# Patient Record
Sex: Male | Born: 1980 | Race: White | Hispanic: No | Marital: Married | Smoking: Never smoker
Health system: Southern US, Community
[De-identification: ages and names within clinical notes are randomized; demographics above are authoritative.]

## PROBLEM LIST (undated history)

## (undated) DIAGNOSIS — I10 Essential (primary) hypertension: Secondary | ICD-10-CM

## (undated) HISTORY — DX: Essential (primary) hypertension: I10

---
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
## 2003-06-26 ENCOUNTER — Emergency Department (HOSPITAL_COMMUNITY): Admission: EM | Admit: 2003-06-26 | Discharge: 2003-06-26 | Payer: Self-pay | Admitting: Emergency Medicine

## 2003-06-28 ENCOUNTER — Emergency Department (HOSPITAL_COMMUNITY): Admission: EM | Admit: 2003-06-28 | Discharge: 2003-06-28 | Payer: Self-pay

## 2005-11-07 ENCOUNTER — Emergency Department (HOSPITAL_COMMUNITY): Admission: EM | Admit: 2005-11-07 | Discharge: 2005-11-07 | Payer: Self-pay | Admitting: *Deleted

## 2010-09-24 ENCOUNTER — Ambulatory Visit: Payer: Self-pay | Admitting: Internal Medicine

## 2010-09-24 DIAGNOSIS — I10 Essential (primary) hypertension: Secondary | ICD-10-CM | POA: Insufficient documentation

## 2010-09-24 DIAGNOSIS — E785 Hyperlipidemia, unspecified: Secondary | ICD-10-CM | POA: Insufficient documentation

## 2010-09-24 LAB — CONVERTED CEMR LAB
ALT: 31 units/L (ref 0–53)
AST: 25 units/L (ref 0–37)
Albumin: 4.5 g/dL (ref 3.5–5.2)
Alkaline Phosphatase: 28 units/L — ABNORMAL LOW (ref 39–117)
BUN: 14 mg/dL (ref 6–23)
Basophils Absolute: 0 10*3/uL (ref 0.0–0.1)
Basophils Relative: 0.6 % (ref 0.0–3.0)
Bilirubin, Direct: 0.2 mg/dL (ref 0.0–0.3)
CO2: 27 meq/L (ref 19–32)
Calcium: 9.9 mg/dL (ref 8.4–10.5)
Chloride: 104 meq/L (ref 96–112)
Cholesterol: 141 mg/dL (ref 0–200)
Creatinine, Ser: 1 mg/dL (ref 0.4–1.5)
Direct LDL: 44.7 mg/dL
Eosinophils Absolute: 0.2 10*3/uL (ref 0.0–0.7)
Eosinophils Relative: 2.4 % (ref 0.0–5.0)
GFR calc non Af Amer: 92.63 mL/min (ref >60)
Glucose, Bld: 100 mg/dL — ABNORMAL HIGH (ref 70–99)
HCT: 42.8 % (ref 39.0–52.0)
HDL: 30 mg/dL — ABNORMAL LOW (ref >39.00)
Hemoglobin: 14.9 g/dL (ref 13.0–17.0)
Lymphocytes Relative: 35.1 % (ref 12.0–46.0)
Lymphs Abs: 2.6 10*3/uL (ref 0.7–4.0)
MCHC: 34.8 g/dL (ref 30.0–36.0)
MCV: 87.2 fL (ref 78.0–100.0)
Monocytes Absolute: 0.6 10*3/uL (ref 0.1–1.0)
Monocytes Relative: 8.2 % (ref 3.0–12.0)
Neutro Abs: 4 10*3/uL (ref 1.4–7.7)
Neutrophils Relative %: 53.7 % (ref 43.0–77.0)
Platelets: 260 10*3/uL (ref 150.0–400.0)
Potassium: 3.7 meq/L (ref 3.5–5.1)
RBC: 4.92 M/uL (ref 4.22–5.81)
RDW: 13.5 % (ref 11.5–14.6)
Sodium: 140 meq/L (ref 135–145)
TSH: 3.03 microintl units/mL (ref 0.35–5.50)
Total Bilirubin: 0.9 mg/dL (ref 0.3–1.2)
Total CHOL/HDL Ratio: 5
Total Protein: 7.2 g/dL (ref 6.0–8.3)
Triglycerides: 427 mg/dL — ABNORMAL HIGH (ref 0.0–149.0)
VLDL: 85.4 mg/dL — ABNORMAL HIGH (ref 0.0–40.0)
WBC: 7.4 10*3/uL (ref 4.5–10.5)

## 2010-09-25 ENCOUNTER — Encounter: Payer: Self-pay | Admitting: Internal Medicine

## 2010-11-25 ENCOUNTER — Ambulatory Visit: Payer: Self-pay | Admitting: Internal Medicine

## 2010-12-02 ENCOUNTER — Encounter: Admit: 2010-12-02 | Payer: Self-pay | Admitting: Internal Medicine

## 2011-01-12 NOTE — Letter (Signed)
Summary: Lipid Letter  [facility]  [address] [place], [place] [address]   Phone: [phone]  Fax: [phone]    09/25/2010  Amiri Riechers [address] [place], [place]  [address]  Dear Jonny Ruiz:  We have carefully reviewed your last lipid profile from  and the results are noted below with a summary of recommendations for lipid management.    Cholesterol:       141     Goal: <200   HDL "good" Cholesterol:   95.28     Goal: >40 too low   LDL "bad" Cholesterol:   45     Goal: <130   Triglycerides:       427.0     Goal: <150 way too high        TLC Diet (Therapeutic Lifestyle Change): Saturated Fats & Transfatty acids should be kept < 7% of total calories ***Reduce Saturated Fats Polyunstaurated Fat can be up to 10% of total calories Monounsaturated Fat Fat can be up to 20% of total calories Total Fat should be no greater than 25-35% of total calories Carbohydrates should be 50-60% of total calories Protein should be approximately 15% of total calories Fiber should be at least 20-30 grams a day ***Increased fiber may help lower LDL Total Cholesterol should be < 200mg /day Consider adding plant stanol/sterols to diet (example: Benacol spread) ***A higher intake of unsaturated fat may reduce Triglycerides and Increase HDL    Adjunctive Measures (may lower LIPIDS and reduce risk of Heart Attack) include: Aerobic Exercise (20-30 minutes 3-4 times a week) Limit Alcohol Consumption Weight Reduction Aspirin 75-81 mg a day by mouth (if not allergic or contraindicated) Dietary Fiber 20-30 grams a day by mouth     Current Medications: 1)    Benicar 40 Mg Tabs (Olmesartan medoxomil) .... One by mouth once daily for high blood pressure  If you have any questions, please call. We appreciate being able to work with you.   Sincerely,    [facility] Etta Grandchild MD

## 2011-01-12 NOTE — Assessment & Plan Note (Signed)
Summary: NEW PT/BCBS/#/LB   Vital Signs:  Patient profile:   30 year old male Height:      71 inches Weight:      221 pounds BMI:     30.93 O2 Sat:      98 % on Room air Temp:     97.9 degrees F oral Pulse rate:   74 / minute Pulse rhythm:   regular Resp:     16 per minute BP sitting:   150 / 94  (left arm) Cuff size:   large  Vitals Entered By: Rock Nephew CMA (September 24, 2010 3:08 PM)  Nutrition Counseling: Patient's BMI is greater than 25 and therefore counseled on weight management options.  O2 Flow:  Room air CC: Patient need to establish a new PCP, Hypertension Management Is Patient Diabetic? No Pain Assessment Patient in pain? no       Does patient need assistance? Functional Status Self care Ambulation Normal   Primary Care Dimond Crotty:  Etta Grandchild MD  CC:  Patient need to establish a new PCP and Hypertension Management.  History of Present Illness: New to me he wants to start medicine for high blood pressure.  Hypertension History:      He denies headache, chest pain, palpitations, dyspnea with exertion, orthopnea, PND, peripheral edema, visual symptoms, neurologic problems, and syncope.        Positive major cardiovascular risk factors include hyperlipidemia and hypertension.  Negative major cardiovascular risk factors include male age less than 66 years old, no history of diabetes, negative family history for ischemic heart disease, and non-tobacco-user status.        Further assessment for target organ damage reveals no history of ASHD, cardiac end-organ damage (CHF/LVH), stroke/TIA, peripheral vascular disease, renal insufficiency, or hypertensive retinopathy.     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    Physical Exam  General:  alert, well-developed, well-nourished, well-hydrated, appropriate dress, normal appearance, healthy-appearing, cooperative to examination, and good hygiene.   Head:  normocephalic, atraumatic, no abnormalities observed, and no abnormalities palpated.   Eyes:  vision grossly intact, pupils equal, pupils round, and pupils reactive to light.   Mouth:  Oral mucosa and oropharynx without lesions or exudates.  Teeth in good repair. Neck:  supple, full ROM, no masses, no thyromegaly, no JVD, normal carotid upstroke, no carotid bruits, no cervical lymphadenopathy, and no neck tenderness.   Lungs:  normal respiratory effort, no intercostal retractions, no accessory muscle use, normal breath sounds, no dullness, no fremitus, no crackles, and no wheezes.   Heart:  normal rate, regular rhythm, no murmur, no gallop, no rub, and no JVD.   Abdomen:  soft, non-tender, normal bowel sounds, no distention, no masses, no guarding, no rigidity, no rebound tenderness, no abdominal hernia, no  inguinal hernia, no hepatomegaly, and no splenomegaly.   Msk:  No deformity or scoliosis noted of thoracic or lumbar spine.   Pulses:  R and L carotid,radial,femoral,dorsalis pedis and posterior tibial pulses are full and equal bilaterally Extremities:  No clubbing,  cyanosis, edema, or deformity noted with normal full range of motion of all joints.   Neurologic:  No cranial nerve deficits noted. Station and gait are normal. Plantar reflexes are down-going bilaterally. DTRs are symmetrical throughout. Sensory, motor and coordinative functions appear intact. Skin:  Intact without suspicious lesions or rashes Cervical Nodes:  no anterior cervical adenopathy and no posterior cervical adenopathy.   Axillary Nodes:  no R axillary adenopathy and no L axillary adenopathy.   Inguinal Nodes:  no R inguinal adenopathy and no L inguinal adenopathy.   Psych:  Cognition and judgment appear intact. Alert and cooperative with normal attention span and concentration. No apparent delusions, illusions, hallucinations   Impression & Recommendations:  Problem # 1:  HYPERTENSION (ICD-401.9) Assessment New  His updated medication list for this problem includes:    Benicar 40 Mg Tabs (Olmesartan medoxomil) ..... One by mouth once daily for high blood pressure  Orders: Venipuncture ([address]) TLB-Lipid Panel (80061-LIPID) TLB-BMP (Basic Metabolic Panel-BMET) (80048-METABOL) TLB-CBC Platelet - w/Differential (85025-CBCD) TLB-Hepatic/Liver Function Pnl (80076-HEPATIC) TLB-TSH (Thyroid Stimulating Hormone) (84443-TSH)  BP today: 150/94  Problem # 2:  HYPERLIPIDEMIA (ICD-272.4) Assessment: New  Orders: Venipuncture (60454) TLB-Lipid Panel (80061-LIPID) TLB-BMP (Basic Metabolic Panel-BMET) (80048-METABOL) TLB-CBC Platelet - w/Differential (85025-CBCD) TLB-Hepatic/Liver Function Pnl (80076-HEPATIC) TLB-TSH (Thyroid Stimulating Hormone) (84443-TSH)  Complete Medication List: 1)  Benicar 40 Mg Tabs (Olmesartan medoxomil) .... One by mouth once daily for high blood pressure  Other Orders: Tdap => 20yrs IM (09811) Admin 1st Vaccine (91478)  Hypertension Assessment/Plan:      The patient's hypertensive risk group is category B: At least one risk factor (excluding  diabetes) with no target organ damage.  Today's blood pressure is 150/94.  His blood pressure goal is < 140/90.  Patient Instructions: 1)  Please schedule a follow-up appointment in 2 months. 2)  It is important that you exercise regularly at least 20 minutes 5 times a week. If you develop chest pain, have severe difficulty breathing, or feel very tired , stop exercising immediately and seek medical attention. 3)  Check your Blood Pressure regularly. If it is above 140/90: you should make an appointment. 4)  Limit your Sodium (Salt) to less than 4 grams a day (slightly less than 1 teaspoon) to prevent fluid retention, swelling, or worsening or symptoms. Prescriptions: BENICAR 40 MG TABS (OLMESARTAN MEDOXOMIL) One by mouth once daily for high blood pressure  #105 x 0   Entered and Authorized by:   Etta Grandchild MD   Signed by:   Etta Grandchild MD on 09/24/2010   Method used:   Samples Given   RxID:   [id number]    Immunizations Administered:  Tetanus Vaccine:    Vaccine Type: Tdap    Site: right deltoid    Mfr: GlaxoSmithKline    Dose: 0.5 ml    Route: IM    Given by: Rock Nephew CMA    VIS given: 10/30/08 version given September 24, 2010.

## 2011-01-14 NOTE — Assessment & Plan Note (Signed)
Summary: PER PT 2 MTH FU--STC   Vital Signs:  Patient profile:   30 year old male Height:      71 inches Weight:      228 pounds BMI:     31.91 O2 Sat:      97 % on Room air Temp:     98.8 degrees F oral Pulse rate:   79 / minute Pulse rhythm:   regular Resp:     16 per minute BP sitting:   126 / 80  (left arm) Cuff size:   large  Vitals Entered By: Rock Nephew CMA (November 25, 2010 9:02 AM)  Nutrition Counseling: Patient's BMI is greater than 25 and therefore counseled on weight management options.  O2 Flow:  Room air CC: Patient here for follow up Is Patient Diabetic? No Pain Assessment Patient in pain? no       Does patient need assistance? Functional Status Self care Ambulation Normal   Primary Care Provider:  Etta Grandchild MD  CC:  Patient here for follow up.  History of Present Illness:  Follow-Up Visit      This is a 30 year old man who presents for Follow-up visit.  The patient denies chest pain, palpitations, dizziness, syncope, low blood sugar symptoms, high blood sugar symptoms, edema, SOB, DOE, PND, and orthopnea.  Since the last visit the patient notes no new problems or concerns.  The patient reports taking meds as prescribed, monitoring BP, and dietary noncompliance.  When questioned about possible medication side effects, the patient notes none.    Preventive Screening-Counseling & Management  Alcohol-Tobacco     Alcohol drinks/day: 0     Alcohol Counseling: not indicated; patient does not drink     Smoking Status: never     Tobacco Counseling: not indicated; no tobacco use  Hep-HIV-STD-Contraception     Hepatitis Risk: no risk noted     HIV Risk: no risk noted     STD Risk: no risk noted      Sexual History:  currently monogamous.        Drug Use:  never.        Blood Transfusions:  no.    Clinical Review Panels:  Immunizations   Last Tetanus Booster:  Tdap (09/24/2010)  Lipid Management   Cholesterol:  141 (09/24/2010)  HDL (good cholesterol):  30.00 (09/24/2010)  Diabetes Management   Creatinine:  1.0 (09/24/2010)  CBC   WBC:  7.4 (09/24/2010)   RBC:  4.92 (09/24/2010)   Hgb:  14.9 (09/24/2010)   Hct:  42.8 (09/24/2010)   Platelets:  260.0 (09/24/2010)   MCV  87.2 (09/24/2010)   MCHC  34.8 (09/24/2010)   RDW  13.5 (09/24/2010)   PMN:  53.7 (09/24/2010)   Lymphs:  35.1 (09/24/2010)   Monos:  8.2 (09/24/2010)   Eosinophils:  2.4 (09/24/2010)   Basophil:  0.6 (09/24/2010)  Complete Metabolic Panel   Glucose:  100 (09/24/2010)   Sodium:  140 (09/24/2010)   Potassium:  3.7 (09/24/2010)   Chloride:  104 (09/24/2010)   CO2:  27 (09/24/2010)   BUN:  14 (09/24/2010)   Creatinine:  1.0 (09/24/2010)   Albumin:  4.5 (09/24/2010)   Total Protein:  7.2 (09/24/2010)   Calcium:  9.9 (09/24/2010)   Total Bili:  0.9 (09/24/2010)   Alk Phos:  28 (09/24/2010)   SGPT (ALT):  31 (09/24/2010)   SGOT (AST):  25 (09/24/2010)   Medications Prior to Update: 1)  Benicar 40 Mg Tabs (  Olmesartan Medoxomil) .... One By Mouth Once Daily For High Blood Pressure  Current Medications (verified): 1)  Benicar 40 Mg Tabs (Olmesartan Medoxomil) .... 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 Family History Diabetes 1st degree relative Family History High cholesterol Family History Hypertension  Social History: Reviewed history from 09/24/2010 and no changes required. 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  The patient denies anorexia, fever, weight loss, chest pain, syncope, dyspnea on exertion, peripheral edema, prolonged cough, headaches, hemoptysis, abdominal pain, hematuria, muscle weakness, suspicious skin lesions, difficulty walking, depression, unusual weight change, and abnormal bleeding.    Physical Exam  General:  alert, well-developed, well-nourished, well-hydrated, appropriate dress, normal appearance, healthy-appearing, cooperative to examination, and good hygiene.  overweight-appearing.   Head:  normocephalic, atraumatic, no abnormalities observed, and no abnormalities palpated.   Eyes:  vision grossly intact and no injection.   Mouth:  Oral mucosa and oropharynx without lesions or exudates.  Teeth in good repair. Neck:  supple, full ROM, no masses, no thyromegaly, no JVD, normal carotid upstroke, no carotid bruits, no cervical lymphadenopathy, and no neck tenderness.   Lungs:  normal respiratory effort, no intercostal retractions, no accessory muscle use, normal breath sounds, no dullness, no fremitus, no crackles, and no wheezes.   Heart:  normal rate, regular rhythm, no murmur, no gallop, no rub, and no JVD.   Abdomen:  soft, non-tender, normal bowel sounds, no distention, no masses, no guarding, no rigidity, no rebound tenderness, no abdominal hernia, no inguinal hernia, no hepatomegaly, and no splenomegaly.   Msk:  No deformity or scoliosis noted of  thoracic or lumbar spine.   Pulses:  R and L carotid,radial,femoral,dorsalis pedis and posterior tibial pulses are full and equal bilaterally Extremities:  No clubbing, cyanosis, edema, or deformity noted with normal full range of motion of all joints.   Neurologic:  No cranial nerve deficits noted. Station and gait are normal. Plantar reflexes are down-going bilaterally. DTRs are symmetrical throughout. Sensory, motor and coordinative functions appear intact. Skin:  Intact without suspicious lesions or rashes Cervical Nodes:  no anterior cervical adenopathy and no posterior cervical adenopathy.   Axillary Nodes:  no R axillary adenopathy and no L axillary adenopathy.   Psych:  Cognition and judgment appear intact. Alert and cooperative with normal attention span and concentration. No apparent delusions, illusions, hallucinations   Impression & Recommendations:  Problem # 1:  HYPERTENSION (ICD-401.9) Assessment Improved  His updated medication list for this problem includes:    Benicar 40 Mg Tabs (Olmesartan medoxomil) ..... One by mouth once daily for high blood pressure  Orders:  TLB-BMP (Basic Metabolic Panel-BMET) (80048-METABOL) Nutrition Referral (Nutrition)  BP today: 126/80 Prior BP: 150/94 (09/24/2010)  Prior 10 Yr Risk Heart Disease: Not enough information (09/24/2010)  Labs Reviewed: K+: 3.7 (09/24/2010) Creat: : 1.0 (09/24/2010)   Chol: 141 (09/24/2010)   HDL: 30.00 (09/24/2010)   TG: 427.0 (09/24/2010)  Problem # 2:  HYPERLIPIDEMIA (ICD-272.4) Assessment: Deteriorated  Orders: Venipuncture (81191) TLB-Lipid Panel (80061-LIPID) Nutrition Referral (Nutrition)  Labs Reviewed: SGOT: 25 (09/24/2010)   SGPT: 31 (09/24/2010)  Prior 10 Yr Risk Heart Disease: Not enough information (09/24/2010)   HDL:30.00 (09/24/2010)  Chol:141 (09/24/2010)  Trig:427.0 (09/24/2010)  Complete Medication List: 1)  Benicar 40 Mg Tabs (Olmesartan medoxomil) .... One by mouth once daily  for high blood pressure  Patient Instructions: 1)  Please schedule a follow-up appointment in 4 months. 2)  It is important that you exercise regularly at least 20 minutes 5 times a week. If you develop chest pain, have severe difficulty breathing, or feel very tired , stop exercising immediately and seek medical attention. 3)  You need to lose weight. Consider a lower calorie diet and regular exercise.  4)  Check your Blood Pressure regularly. If it is above 130/80: you should make an appointment. Prescriptions: BENICAR 40 MG TABS (OLMESARTAN MEDOXOMIL) One by mouth once daily for high blood pressure  #30 x 11   Entered and Authorized by:   Etta Grandchild MD   Signed by:   Etta Grandchild MD on 11/25/2010   Method used:   Print then Give to Patient   RxID:   [id number]    Orders Added: 1)  Venipuncture [46962] 2)  TLB-Lipid Panel [80061-LIPID] 3)  TLB-BMP (Basic Metabolic Panel-BMET) [80048-METABOL] 4)  Nutrition Referral [Nutrition] 5)  Est. Patient Level IV [95284]

## 2011-03-17 ENCOUNTER — Emergency Department (HOSPITAL_COMMUNITY): Payer: BC Managed Care – PPO

## 2011-03-17 ENCOUNTER — Emergency Department (HOSPITAL_COMMUNITY)
Admission: EM | Admit: 2011-03-17 | Discharge: 2011-03-17 | Disposition: A | Discharge status: Home / Self Care | Payer: BC Managed Care – PPO | Attending: Emergency Medicine | Admitting: Emergency Medicine

## 2011-03-17 ENCOUNTER — Telehealth: Payer: Self-pay | Admitting: *Deleted

## 2011-03-17 DIAGNOSIS — R209 Unspecified disturbances of skin sensation: Secondary | ICD-10-CM | POA: Insufficient documentation

## 2011-03-17 DIAGNOSIS — R29810 Facial weakness: Secondary | ICD-10-CM | POA: Insufficient documentation

## 2011-03-17 DIAGNOSIS — I10 Essential (primary) hypertension: Secondary | ICD-10-CM | POA: Insufficient documentation

## 2011-03-17 DIAGNOSIS — R51 Headache: Secondary | ICD-10-CM | POA: Insufficient documentation

## 2011-03-17 DIAGNOSIS — G510 Bell's palsy: Secondary | ICD-10-CM | POA: Insufficient documentation

## 2011-03-17 DIAGNOSIS — M542 Cervicalgia: Secondary | ICD-10-CM | POA: Insufficient documentation

## 2011-03-17 NOTE — Telephone Encounter (Signed)
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 Will f/u next week if no better or worse.

## 2011-03-17 NOTE — Telephone Encounter (Signed)
ok 

## 2011-03-17 NOTE — Telephone Encounter (Signed)
Patient requesting work in apt w/Dr Yetta Barre. He was seen by UC for tingling in his face and tongue. They "couldn't tell him anything" - per his google research he thinks he has bells palsy. Pt is very worried and req work in apt w/MD soon.

## 2012-02-17 ENCOUNTER — Ambulatory Visit: Payer: BC Managed Care – PPO | Admitting: Internal Medicine

## 2012-02-18 ENCOUNTER — Encounter: Payer: Self-pay | Admitting: Internal Medicine

## 2012-02-18 ENCOUNTER — Other Ambulatory Visit (INDEPENDENT_AMBULATORY_CARE_PROVIDER_SITE_OTHER): Payer: BC Managed Care – PPO

## 2012-02-18 ENCOUNTER — Ambulatory Visit (INDEPENDENT_AMBULATORY_CARE_PROVIDER_SITE_OTHER): Payer: BC Managed Care – PPO | Admitting: Internal Medicine

## 2012-02-18 DIAGNOSIS — I10 Essential (primary) hypertension: Secondary | ICD-10-CM

## 2012-02-18 DIAGNOSIS — G4730 Sleep apnea, unspecified: Secondary | ICD-10-CM

## 2012-02-18 DIAGNOSIS — E785 Hyperlipidemia, unspecified: Secondary | ICD-10-CM

## 2012-02-18 DIAGNOSIS — R7309 Other abnormal glucose: Secondary | ICD-10-CM

## 2012-02-18 DIAGNOSIS — G4733 Obstructive sleep apnea (adult) (pediatric): Secondary | ICD-10-CM | POA: Insufficient documentation

## 2012-02-18 LAB — CBC WITH DIFFERENTIAL/PLATELET
Basophils Relative: 0.4 % (ref 0.0–3.0)
Eosinophils Absolute: 0.2 10*3/uL (ref 0.0–0.7)
HCT: 44 % (ref 39.0–52.0)
Lymphs Abs: 2.4 10*3/uL (ref 0.7–4.0)
MCHC: 34.7 g/dL (ref 30.0–36.0)
MCV: 86.8 fl (ref 78.0–100.0)
Monocytes Absolute: 0.7 10*3/uL (ref 0.1–1.0)
Neutro Abs: 5.2 10*3/uL (ref 1.4–7.7)
Neutrophils Relative %: 61 % (ref 43.0–77.0)
RBC: 5.06 Mil/uL (ref 4.22–5.81)

## 2012-02-18 LAB — COMPREHENSIVE METABOLIC PANEL
Alkaline Phosphatase: 25 U/L — ABNORMAL LOW (ref 39–117)
BUN: 18 mg/dL (ref 6–23)
Glucose, Bld: 60 mg/dL — ABNORMAL LOW (ref 70–99)
Total Bilirubin: 0.6 mg/dL (ref 0.3–1.2)

## 2012-02-18 LAB — LIPID PANEL
Cholesterol: 146 mg/dL (ref 0–200)
HDL: 39.7 mg/dL (ref >39.00)
VLDL: 63 mg/dL — ABNORMAL HIGH (ref 0.0–40.0)

## 2012-02-18 LAB — URINALYSIS, ROUTINE W REFLEX MICROSCOPIC
Hgb urine dipstick: NEGATIVE
Ketones, ur: NEGATIVE
Specific Gravity, Urine: 1.025 (ref 1.000–1.030)
Total Protein, Urine: NEGATIVE
Urine Glucose: NEGATIVE
Urobilinogen, UA: 0.2 (ref 0.0–1.0)

## 2012-02-18 LAB — HEMOGLOBIN A1C: Hgb A1c MFr Bld: 5 % (ref 4.6–6.5)

## 2012-02-18 LAB — LDL CHOLESTEROL, DIRECT: Direct LDL: 46.5 mg/dL

## 2012-02-18 MED ORDER — OLMESARTAN MEDOXOMIL 20 MG PO TABS: 20.0000 mg | ORAL_TABLET | Freq: Every day | ORAL | Status: DC

## 2012-02-18 NOTE — Assessment & Plan Note (Signed)
I will recheck his FLP today and have encouraged him to improve on his lifestyle modifications

## 2012-02-18 NOTE — Assessment & Plan Note (Signed)
He will restart Benicar

## 2012-02-18 NOTE — Assessment & Plan Note (Signed)
Labs ordered today to look for secondary causes and complications, he will get a sleep eval done as well. He agrees to work on his lifestyle modifications as well.

## 2012-02-18 NOTE — Assessment & Plan Note (Signed)
a1c today to see if he has DM II

## 2012-02-18 NOTE — Patient Instructions (Signed)

## 2012-02-18 NOTE — Progress Notes (Signed)
  Subjective:    Patient ID: Erik Huffman, male    DOB: 07/04/1981, 31 y.o.   MRN: [id number]  Hypertension This is a chronic problem. The current episode started more than 1 year ago. The problem has been gradually worsening since onset. The problem is uncontrolled. Pertinent negatives include no anxiety, blurred vision, chest pain, headaches, malaise/fatigue, neck pain, orthopnea, palpitations, peripheral edema, PND, shortness of breath or sweats. There are no associated agents to hypertension. Past treatments include nothing. Compliance problems include exercise, diet and psychosocial issues.       Review of Systems  Constitutional: Positive for fatigue and unexpected weight change (weight gain). Negative for fever, chills, malaise/fatigue, diaphoresis, activity change and appetite change.  HENT: Negative.  Negative for neck pain.   Eyes: Negative.  Negative for blurred vision.  Respiratory: Positive for apnea (and heavy snoring) and choking (and gasping at night). Negative for cough, chest tightness, shortness of breath, wheezing and stridor.   Cardiovascular: Negative for chest pain, palpitations, orthopnea, leg swelling and PND.  Gastrointestinal: Negative for nausea, vomiting, abdominal pain, diarrhea, constipation, blood in stool, abdominal distention, anal bleeding and rectal pain.  Genitourinary: Negative.   Musculoskeletal: Negative for myalgias, back pain, joint swelling, arthralgias and gait problem.  Skin: Negative for color change, pallor, rash and wound.  Neurological: Negative for dizziness, tremors, seizures, syncope, facial asymmetry, speech difficulty, weakness, light-headedness, numbness and headaches.  Hematological: Negative for adenopathy. Does not bruise/bleed easily.  Psychiatric/Behavioral: Negative.        Objective:   Physical Exam  Vitals reviewed. Constitutional: He is oriented to person, place, and time. He appears well-developed and well-nourished. No  distress.  HENT:  Head: Normocephalic and atraumatic.  Mouth/Throat: Oropharynx is clear and moist. No oropharyngeal exudate.  Eyes: Conjunctivae are normal. Right eye exhibits no discharge. Left eye exhibits no discharge. No scleral icterus.  Neck: Normal range of motion. Neck supple. No JVD present. No tracheal deviation present. No thyromegaly present.  Cardiovascular: Normal rate, regular rhythm, normal heart sounds and intact distal pulses.  Exam reveals no gallop and no friction rub.   No murmur heard. Pulmonary/Chest: Effort normal and breath sounds normal. No stridor. No respiratory distress. He has no wheezes. He has no rales. He exhibits no tenderness.  Abdominal: Soft. Bowel sounds are normal. He exhibits no distension and no mass. There is no tenderness. There is no rebound and no guarding.  Musculoskeletal: Normal range of motion. He exhibits no edema and no tenderness.  Lymphadenopathy:    He has no cervical adenopathy.  Neurological: He is oriented to person, place, and time.  Skin: Skin is warm and dry. No rash noted. He is not diaphoretic. No erythema. No pallor.  Psychiatric: He has a normal mood and affect. His behavior is normal. Judgment and thought content normal.     Lab Results  Component Value Date   WBC 7.4 09/24/2010   HGB 14.9 09/24/2010   HCT 42.8 09/24/2010   PLT 260.0 09/24/2010   GLUCOSE 100* 09/24/2010   CHOL 141 09/24/2010   TRIG 427.0* 09/24/2010   HDL 30.00* 09/24/2010   LDLDIRECT 44.7 09/24/2010   ALT 31 09/24/2010   AST 25 09/24/2010   NA 140 09/24/2010   K 3.7 09/24/2010   CL 104 09/24/2010   CREATININE 1.0 09/24/2010   BUN 14 09/24/2010   CO2 27 09/24/2010   TSH 3.03 09/24/2010       Assessment & Plan:

## 2012-03-03 ENCOUNTER — Encounter: Payer: Self-pay | Admitting: Pulmonary Disease

## 2012-03-03 ENCOUNTER — Ambulatory Visit (INDEPENDENT_AMBULATORY_CARE_PROVIDER_SITE_OTHER): Payer: BC Managed Care – PPO | Admitting: Pulmonary Disease

## 2012-03-03 VITALS — BP 130/78 | HR 87 | Temp 98.4°F | Ht 71.0 in | Wt 234.8 lb

## 2012-03-03 DIAGNOSIS — G4733 Obstructive sleep apnea (adult) (pediatric): Secondary | ICD-10-CM

## 2012-03-03 NOTE — Assessment & Plan Note (Signed)
The patient's history is very suggestive of clinically significant sleep disordered breathing.  I've had a long discussion with him about sleep apnea, including its impact to his cardiovascular health and quality of life.  I think he needs to have a sleep study done, and given my high suspicion and his lack of significant medical problems, I think he is a good candidate for home sleep testing.

## 2012-03-03 NOTE — Progress Notes (Signed)
  Subjective:    Patient ID: Erik Huffman, male    DOB: [birth year], 31 y.o.   MRN: [id number]  HPI The patient is a 31 year old male who I've been asked to see for possible obstructive sleep apnea.  He's been noted to have very loud snoring, as well as an abnormal breathing pattern during sleep.  He describes very restless sleep, and never feels rested in the mornings upon arising.  He does work third shift, but typically gets 5-7 hours of sleep a day.  He is trying to go to school and at the same time work and care for his kids.  The patient describes significant sleepiness with periods of inactivity.  He will have slight pressure while watching television or movies, and also has some sleep pressure driving longer distances.  His weight has increased about 25 pounds over the last few years, and his Epworth score today is elevated at 12.  Sleep Questionnaire: What time do you typically go to bed?( Between what hours) 3pm to 5 pm How long does it take you to fall asleep? 1 hour How many times during the night do you wake up? 2 What time do you get out of bed to start your day? 2200 Do you drive or operate heavy machinery in your occupation? Yes How much has your weight changed (up or down) over the past two years? (In pounds) 25 lb (11.34 kg) Have you ever had a sleep study before? No Do you currently use CPAP? No Do you wear oxygen at any time? No     Review of Systems  Constitutional: Positive for unexpected weight change. Negative for fever.  HENT: Negative for ear pain, nosebleeds, congestion, sore throat, rhinorrhea, sneezing, trouble swallowing, dental problem, postnasal drip and sinus pressure.   Eyes: Negative for redness and itching.  Respiratory: Positive for cough. Negative for chest tightness, shortness of breath and wheezing.   Cardiovascular: Negative for palpitations and leg swelling.  Gastrointestinal: Negative for nausea and vomiting.  Genitourinary: Negative for dysuria.    Musculoskeletal: Negative for joint swelling.  Skin: Negative for rash.  Neurological: Positive for headaches.  Hematological: Does not bruise/bleed easily.  Psychiatric/Behavioral: Negative for dysphoric mood. The patient is not nervous/anxious.        Objective:   Physical Exam Constitutional:  Well developed, no acute distress  HENT:  Nares patent without discharge, enlarged turbinates.  Oropharynx without exudate, palate and uvula are thick and elongated.  Eyes:  Perrla, eomi, no scleral icterus  Neck:  No JVD, no TMG  Cardiovascular:  Normal rate, regular rhythm, no rubs or gallops.  No murmurs        Intact distal pulses  Pulmonary :  Normal breath sounds, no stridor or respiratory distress   No rales, rhonchi, or wheezing  Abdominal:  Soft, nondistended, bowel sounds present.  No tenderness noted.   Musculoskeletal:  No lower extremity edema noted.  Lymph Nodes:  No cervical lymphadenopathy noted  Skin:  No cyanosis noted  Neurologic:  Appears sleepy, appropriate, moves all 4 extremities without obvious deficit.         Assessment & Plan:

## 2012-03-03 NOTE — Patient Instructions (Signed)
Will try to get home sleep testing if allowed by insurance.  If not, will schedule at sleep center. Work on [employer] loss Will arrange followup once results available.

## 2012-04-04 ENCOUNTER — Ambulatory Visit (INDEPENDENT_AMBULATORY_CARE_PROVIDER_SITE_OTHER): Payer: BC Managed Care – PPO | Admitting: Pulmonary Disease

## 2012-04-04 DIAGNOSIS — G4733 Obstructive sleep apnea (adult) (pediatric): Secondary | ICD-10-CM

## 2012-04-05 ENCOUNTER — Telehealth: Payer: Self-pay | Admitting: *Deleted

## 2012-04-05 NOTE — Telephone Encounter (Signed)
Per KC, pt needs ov with him to discuss sleep study results.  LMOM for pt TCB 

## 2012-04-07 NOTE — Telephone Encounter (Signed)
LMOM for pt TCB 

## 2012-04-10 ENCOUNTER — Encounter: Payer: Self-pay | Admitting: *Deleted

## 2012-04-10 NOTE — Telephone Encounter (Signed)
LMOM for pt TCB.  This is our 3rd attempt to contact pt with no response.  Per protocol, will sign off on message and send pt a letter to call our office to make an appt to discuss results.

## 2012-04-11 ENCOUNTER — Ambulatory Visit: Payer: BC Managed Care – PPO | Admitting: Pulmonary Disease

## 2012-04-28 ENCOUNTER — Encounter: Payer: Self-pay | Admitting: Pulmonary Disease

## 2012-04-28 ENCOUNTER — Ambulatory Visit (INDEPENDENT_AMBULATORY_CARE_PROVIDER_SITE_OTHER): Payer: BC Managed Care – PPO | Admitting: Pulmonary Disease

## 2012-04-28 VITALS — BP 120/84 | HR 104 | Temp 98.3°F | Ht 71.0 in | Wt 231.0 lb

## 2012-04-28 DIAGNOSIS — G4733 Obstructive sleep apnea (adult) (pediatric): Secondary | ICD-10-CM

## 2012-04-28 NOTE — Assessment & Plan Note (Signed)
The patient has been found to have moderate to severe obstructive sleep apnea by his recent sleep study.  I have explained to the patient that weight loss coupled with CPAP would be his best treatment option given its severity.  The patient is willing to try this. I will set the patient up on cpap at a moderate pressure level to allow for desensitization, and will troubleshoot the device over the next 4-6weeks if needed.  The pt is to call me if having issues with tolerance.  Will then optimize the pressure once patient is able to wear cpap on a consistent basis.

## 2012-04-28 NOTE — Progress Notes (Signed)
  Subjective:    Patient ID: Erik Huffman, male    DOB: Jun 25, 1981, 31 y.o.   MRN: [id number]  HPI Patient comes in today for followup after his recent sleep test.  He was found to have moderate to severe obstructive sleep apnea, with an AHI of 34 events per hour and significant oxygen desaturation.  I have reviewed the study with him in detail, and answered all of his questions.   Review of Systems  Constitutional: Negative.  Negative for fever and unexpected weight change.  HENT: Positive for rhinorrhea and sneezing. Negative for ear pain, nosebleeds, congestion, sore throat, trouble swallowing, dental problem, postnasal drip and sinus pressure.   Eyes: Negative.  Negative for redness and itching.  Respiratory: Negative.  Negative for cough, chest tightness, shortness of breath and wheezing.   Cardiovascular: Negative.  Negative for palpitations and leg swelling.  Gastrointestinal: Negative.  Negative for nausea and vomiting.  Genitourinary: Negative.  Negative for dysuria.  Musculoskeletal: Negative.  Negative for joint swelling.  Skin: Negative.  Negative for rash.  Neurological: Negative.  Negative for headaches.  Hematological: Negative.  Does not bruise/bleed easily.  Psychiatric/Behavioral: Negative.  Negative for dysphoric mood. The patient is not nervous/anxious.        Objective:   Physical Exam Obese male in no acute distress Nose without purulence or discharge noted Lower extremities with no edema, no cyanosis Patient appears extremely sleepy, but appropriate, moves all 4 extremities.       Assessment & Plan:

## 2012-04-28 NOTE — Patient Instructions (Signed)
Will start on cpap with full face mask.  Please call if having issues with tolerance. Will see you back in 6 weeks. Work on weight loss

## 2012-05-03 ENCOUNTER — Telehealth: Payer: Self-pay | Admitting: Pulmonary Disease

## 2012-05-03 NOTE — Telephone Encounter (Signed)
[email or web address]  

## 2012-05-04 NOTE — Telephone Encounter (Signed)
I spoke with pt and he is aware.  

## 2012-05-04 NOTE — Telephone Encounter (Signed)
I called [facility] and they state they have received the order and the respiratory therapists has the order and will contact the pt today or tomorrow.  I [id number] to advise the pt. Carron Curie, CMA

## 2012-05-04 NOTE — Telephone Encounter (Signed)
Pt returned call.  Call back @ [phone] Leanora Ivanoff

## 2012-05-16 ENCOUNTER — Ambulatory Visit (INDEPENDENT_AMBULATORY_CARE_PROVIDER_SITE_OTHER): Payer: BC Managed Care – PPO | Admitting: Family Medicine

## 2012-05-16 VITALS — BP 129/83 | HR 80 | Temp 98.2°F | Resp 16 | Ht 70.0 in | Wt 227.0 lb

## 2012-05-16 DIAGNOSIS — Z0189 Encounter for other specified special examinations: Secondary | ICD-10-CM

## 2012-05-16 DIAGNOSIS — Z7689 Persons encountering health services in other specified circumstances: Secondary | ICD-10-CM

## 2012-05-16 NOTE — Progress Notes (Signed)
  Patient Name: Erik Huffman Date of Birth: [birth year] Medical Record Number: [id number] Gender: male Date of Encounter: 05/16/2012  History of Present Illness:  Erik Huffman is a 31 y.o. very pleasant male patient who presents with the following:  He works 3rd shift and has OSA. He missed work last night because he was too tired as he had been caring for his 2 children- 3 years and 3 months.  The baby has diarrhea.  He would like to have a note to [facility] as he is not allowed back without a note.  He is not sick himself.  He also goes to school and it is difficult to find time for sleep  Patient Active Problem List  Diagnoses  . HYPERLIPIDEMIA  . HYPERTENSION  . Other abnormal glucose  . OSA (obstructive sleep apnea)   Past Medical History  Diagnosis Date  . Hyperlipidemia   . Hypertension    No past surgical history on file. History  Substance Use Topics  . Smoking status: Never Smoker   . Smokeless tobacco: Never Used  . Alcohol Use: No   Family History  Problem Relation Age of Onset  . Hypertension Mother   . Hyperlipidemia Father   . Cancer Neg Hx   . Heart disease Father   . Stroke Neg Hx    No Known Allergies  Medication list has been reviewed and updated.  Prior to Admission medications   Medication Sig Start Date End Date Taking? Authorizing Provider  fexofenadine (ALLEGRA ALLERGY) 180 MG tablet Take 180 mg by mouth as needed.   Yes Historical Provider, MD  olmesartan (BENICAR) 20 MG tablet Take 1 tablet (20 mg total) by mouth daily. 02/18/12 02/17/13 Yes Etta Grandchild, MD    Review of Systems:  As per HPI- otherwise negative.   Physical Examination: Filed Vitals:   05/16/12 1545  BP: 129/83  Pulse: 80  Temp: 98.2 F (36.8 C)  Resp: 16   Filed Vitals:   05/16/12 1545  Height: 5\' 10"  (1.778 m)  Weight: 227 lb (102.967 kg)   Body mass index is 32.57 kg/(m^2).  GEN: WDWN, NAD, Non-toxic, A & O x 3 HEENT: Atraumatic, Normocephalic. Neck supple.  No masses, No LAD. Ears and Nose: No external deformity. CV: RRR, No M/G/R. No JVD. No thrill. No extra heart sounds. PULM: CTA B, no wheezes, crackles, rhonchi. No retractions. No resp. distress. No accessory muscle use. EXTR: No c/c/e NEURO Normal gait.  PSYCH: Normally interactive. Conversant. Not depressed or anxious appearing.  Calm demeanor.    Assessment and Plan: 1. Return to work evaluation    He may return to work tonight- gave note. Try to get rest!    Maykel Reitter, MD 05/16/2012 3:57 PM

## 2012-05-29 ENCOUNTER — Ambulatory Visit (INDEPENDENT_AMBULATORY_CARE_PROVIDER_SITE_OTHER): Payer: BC Managed Care – PPO | Admitting: Family Medicine

## 2012-05-29 VITALS — BP 152/82 | HR 68 | Temp 98.0°F | Resp 17 | Ht 70.5 in | Wt 226.0 lb

## 2012-05-29 DIAGNOSIS — R197 Diarrhea, unspecified: Secondary | ICD-10-CM

## 2012-05-29 NOTE — Patient Instructions (Signed)
Gastroenteritis:  Diarrhea Infections caused by germs (bacterial) or a virus commonly cause diarrhea. Your caregiver has determined that with time, rest and fluids, the diarrhea should improve. In general, eat normally while drinking more water than usual. Although water may prevent dehydration, it does not contain salt and minerals (electrolytes). Broths, weak tea without caffeine and oral rehydration solutions (ORS) replace fluids and electrolytes. Small amounts of fluids should be taken frequently. Large amounts at one time may not be tolerated. Plain water may be harmful in infants and the elderly. Oral rehydrating solutions (ORS) are available at pharmacies and grocery stores. ORS replace water and important electrolytes in proper proportions. Sports drinks are not as effective as ORS and may be harmful due to sugars worsening diarrhea.  ORS is especially recommended for use in children with diarrhea. As a general guideline for children, replace any new fluid losses from diarrhea and/or vomiting with ORS as follows:   If your child weighs 22 pounds or under (10 kg or less), give 60-120 mL ( -  cup or 2 - 4 ounces) of ORS for each episode of diarrheal stool or vomiting episode.   If your child weighs more than 22 pounds (more than 10 kgs), give 120-240 mL ( - 1 cup or 4 - 8 ounces) of ORS for each diarrheal stool or episode of vomiting.   While correcting for dehydration, children should eat normally. However, foods high in sugar should be avoided because this may worsen diarrhea. Large amounts of carbonated soft drinks, juice, gelatin desserts and other highly sugared drinks should be avoided.   After correction of dehydration, other liquids that are appealing to the child may be added. Children should drink small amounts of fluids frequently and fluids should be increased as tolerated. Children should drink enough fluids to keep urine clear or pale yellow.   Adults should eat normally while  drinking more fluids than usual. Drink small amounts of fluids frequently and increase as tolerated. Drink enough fluids to keep urine clear or pale yellow. Broths, weak decaffeinated tea, lemon lime soft drinks (allowed to go flat) and ORS replace fluids and electrolytes.   Avoid:   Carbonated drinks.   Juice.   Extremely hot or cold fluids.   Caffeine drinks.   Fatty, greasy foods.   Alcohol.   Tobacco.   Too much intake of anything at one time.   Gelatin desserts.   Probiotics are active cultures of beneficial bacteria. They may lessen the amount and number of diarrheal stools in adults. Probiotics can be found in yogurt with active cultures and in supplements.   Wash hands well to avoid spreading bacteria and virus.   Anti-diarrheal medications are not recommended for infants and children.   Only take over-the-counter or prescription medicines for pain, discomfort or fever as directed by your caregiver. Do not give aspirin to children because it may cause Reye's Syndrome.   For adults, ask your caregiver if you should continue all prescribed and over-the-counter medicines.   If your caregiver has given you a follow-up appointment, it is very important to keep that appointment. Not keeping the appointment could result in a chronic or permanent injury, and disability. If there is any problem keeping the appointment, you must call back to this facility for assistance.  SEEK IMMEDIATE MEDICAL CARE IF:   You or your child is unable to keep fluids down or other symptoms or problems become worse in spite of treatment.   Vomiting or diarrhea develops and   becomes persistent.   There is vomiting of blood or bile (green material).   There is blood in the stool or the stools are black and tarry.   There is no urine output in 6-8 hours or there is only a small amount of very dark urine.   Abdominal pain develops, increases or localizes.   You have a fever.   Your baby is older  than 3 months with a rectal temperature of 102 F (38.9 C) or higher.   Your baby is 3 months old or younger with a rectal temperature of 100.4 F (38 C) or higher.   You or your child develops excessive weakness, dizziness, fainting or extreme thirst.   You or your child develops a rash, stiff neck, severe headache or become irritable or sleepy and difficult to awaken.  MAKE SURE YOU:   Understand these instructions.   Will watch your condition.   Will get help right away if you are not doing well or get worse.  Document Released: 11/19/2002 Document Revised: 11/18/2011 Document Reviewed: 10/06/2009 ExitCare Patient Information 2012 [employer].  Nausea and Vomiting Nausea is a sick feeling that often comes before throwing up (vomiting). Vomiting is a reflex where stomach contents come out of your mouth. Vomiting can cause severe loss of body fluids (dehydration). Children and elderly adults can become dehydrated quickly, especially if they also have diarrhea. Nausea and vomiting are symptoms of a condition or disease. It is important to find the cause of your symptoms. CAUSES   Direct irritation of the stomach lining. This irritation can result from increased acid production (gastroesophageal reflux disease), infection, food poisoning, taking certain medicines (such as nonsteroidal anti-inflammatory drugs), alcohol use, or tobacco use.   Signals from the brain.These signals could be caused by a headache, heat exposure, an inner ear disturbance, increased pressure in the brain from injury, infection, a tumor, or a concussion, pain, emotional stimulus, or metabolic problems.   An obstruction in the gastrointestinal tract (bowel obstruction).   Illnesses such as diabetes, hepatitis, gallbladder problems, appendicitis, kidney problems, cancer, sepsis, atypical symptoms of a heart attack, or eating disorders.   Medical treatments such as chemotherapy and radiation.   Receiving  medicine that makes you sleep (general anesthetic) during surgery.  DIAGNOSIS Your caregiver may ask for tests to be done if the problems do not improve after a few days. Tests may also be done if symptoms are severe or if the reason for the nausea and vomiting is not clear. Tests may include:  Urine tests.   Blood tests.   Stool tests.   Cultures (to look for evidence of infection).   X-rays or other imaging studies.  Test results can help your caregiver make decisions about treatment or the need for additional tests. TREATMENT You need to stay well hydrated. Drink frequently but in small amounts.You may wish to drink water, sports drinks, clear broth, or eat frozen ice pops or gelatin dessert to help stay hydrated.When you eat, eating slowly may help prevent nausea.There are also some antinausea medicines that may help prevent nausea. HOME CARE INSTRUCTIONS   Take all medicine as directed by your caregiver.   If you do not have an appetite, do not force yourself to eat. However, you must continue to drink fluids.   If you have an appetite, eat a normal diet unless your caregiver tells you differently.   Eat a variety of complex carbohydrates (rice, wheat, potatoes, bread), lean meats, yogurt, fruits, and vegetables.     Avoid high-fat foods because they are more difficult to digest.   Drink enough water and fluids to keep your urine clear or pale yellow.   If you are dehydrated, ask your caregiver for specific rehydration instructions. Signs of dehydration may include:   Severe thirst.   Dry lips and mouth.   Dizziness.   Dark urine.   Decreasing urine frequency and amount.   Confusion.   Rapid breathing or pulse.  SEEK IMMEDIATE MEDICAL CARE IF:   You have blood or brown flecks (like coffee grounds) in your vomit.   You have black or bloody stools.   You have a severe headache or stiff neck.   You are confused.   You have severe abdominal pain.   You have  chest pain or trouble breathing.   You do not urinate at least once every 8 hours.   You develop cold or clammy skin.   You continue to vomit for longer than 24 to 48 hours.   You have a fever.  MAKE SURE YOU:   Understand these instructions.   Will watch your condition.   Will get help right away if you are not doing well or get worse.  Document Released: 11/29/2005 Document Revised: 11/18/2011 Document Reviewed: 04/28/2011 ExitCare Patient Information 2012 [employer].  Return to the clinic or go to the nearest emergency room if any of your symptoms worsen or new symptoms occur.   

## 2012-05-29 NOTE — Progress Notes (Signed)
  Subjective:    Patient ID: Erik Huffman, male    DOB: January 18, 1981, 31 y.o.   MRN: [id number]  HPI Erik Huffman is a 31 y.o. male Here for a doctor's note.  Went to [employer] yesterday afternoon.  Had diarrhea last night - 8pm, 3 episodes.  No fever, no vomiting.  No diarrhea today.  Drinking water, and able to eat bacon and eggs today.  Works 3rd shift - 11p - 7 a, unable to go in last night.  Shift lead.  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 No abd pain today.   Last time missing work few weekends ago - due to no sleep - hx of sleep apnea.    Review of Systems  Constitutional: Negative for fever and chills.  Gastrointestinal: Positive for diarrhea. Negative for nausea, vomiting, abdominal pain and abdominal distention.  Genitourinary: Negative for urgency, frequency, hematuria, decreased urine volume and difficulty urinating.       Objective:   Physical Exam  Constitutional: He is oriented to person, place, and time. He appears well-developed and well-nourished.  HENT:  Head: Normocephalic and atraumatic.  Mouth/Throat: Oropharyngeal exudate present.  Neck: Normal range of motion.  Cardiovascular: Normal rate, regular rhythm, normal heart sounds and intact distal pulses.   Pulmonary/Chest: Effort normal and breath sounds normal.  Abdominal: Soft. Bowel sounds are normal. He exhibits no distension. There is no tenderness. There is no rebound and no guarding.  Neurological: He is alert and oriented to person, place, and time.  Skin: Skin is warm and dry.       Normal turgor.   Psychiatric: He has a normal mood and affect. His behavior is normal.       Assessment & Plan:  Erik Huffman is a 31 y.o. male 1. Diarrhea   Now resolved -push fluids, bland diet. rtc precautions. PT info given.  Note for work last night.

## 2013-03-01 ENCOUNTER — Ambulatory Visit (INDEPENDENT_AMBULATORY_CARE_PROVIDER_SITE_OTHER): Payer: BC Managed Care – PPO | Admitting: Physician Assistant

## 2013-03-01 VITALS — BP 151/100 | HR 98 | Temp 97.8°F | Resp 16 | Ht 70.25 in | Wt 227.4 lb

## 2013-03-01 DIAGNOSIS — I10 Essential (primary) hypertension: Secondary | ICD-10-CM

## 2013-03-01 DIAGNOSIS — H1013 Acute atopic conjunctivitis, bilateral: Secondary | ICD-10-CM

## 2013-03-01 DIAGNOSIS — H1045 Other chronic allergic conjunctivitis: Secondary | ICD-10-CM

## 2013-03-01 MED ORDER — AZELASTINE HCL 0.05 % OP SOLN: 1.0000 [drp] | Freq: Two times a day (BID) | OPHTHALMIC | Status: DC

## 2013-03-01 NOTE — Progress Notes (Signed)
  Subjective:    Patient ID: Erik Huffman, male    DOB: [birth year], 32 y.o.   MRN: [id number]  HPI 32 year old male presents with 1 week history of bilateral eye erythema, pruritis, and watery drainage. States right eye is worse than the left. Symptoms have been waxing and waning over the last 10 days, and have intermittent occurred since he began to suffer from allergies last summer.  He has been taking Allegra daily which has helped some, but once that stopped helping, he has started taking 2 tablets daily.  Admits that neither zyrtec or claritin help.  Complains of slight nasal congestion, but denies PND or cough.  No fever, chills, nausea, vomiting, dizziness, headache, orbital pain, photophobia, or vision changes.   PMHx: HTN, hyperlipidemia, and OSA treated at [facility].  BP elevated today - patient reports he has recently had an energy drink and also only takes his medication "sometimes".    Review of Systems  Constitutional: Negative for fever and chills.  HENT: Positive for rhinorrhea. Negative for ear pain, congestion, sore throat, postnasal drip and sinus pressure.   Eyes: Positive for discharge (watery), redness and itching. Negative for photophobia, pain and visual disturbance.  Respiratory: Negative for cough and shortness of breath.   Neurological: Negative for dizziness and headaches.       Objective:   Physical Exam  Constitutional: He is oriented to person, place, and time. He appears well-developed and well-nourished.  HENT:  Head: Normocephalic and atraumatic.  Right Ear: Hearing, tympanic membrane, external ear and ear canal normal.  Left Ear: Hearing, tympanic membrane, external ear and ear canal normal.  Mouth/Throat: Uvula is midline, oropharynx is clear and moist and mucous membranes are normal. No oropharyngeal exudate.  Eyes: EOM are normal. Pupils are equal, round, and reactive to light. Right conjunctiva is injected (with watery drainage).  Right eyelid swollen  and erythematous. Right eye anesthetized with procainamide soln and stained with fluoresceine.  No abrasions, ulcerations, or FB noted.   Neck: Normal range of motion.  Cardiovascular: Normal rate, regular rhythm and normal heart sounds.   Pulmonary/Chest: Effort normal and breath sounds normal.  Neurological: He is alert and oriented to person, place, and time.  Psychiatric: He has a normal mood and affect. His behavior is normal. Judgment and thought content normal.          Assessment & Plan:   1. Allergic conjunctivitis, bilateral - Plan: azelastine (OPTIVAR) 0.05 % ophthalmic solution  -Optivar twice daily   -Continue Allegra ONE tablet daily  -If symptoms fail to improve over the next 48-72 hours, recommend referral to opthalmology for further evaluation 2. Unspecified essential hypertension  -Take your medication daily!  -Follow up with PCP to ensure better control of HTN.

## 2013-03-05 ENCOUNTER — Ambulatory Visit (INDEPENDENT_AMBULATORY_CARE_PROVIDER_SITE_OTHER): Payer: BC Managed Care – PPO | Admitting: Internal Medicine

## 2013-03-05 ENCOUNTER — Encounter: Payer: Self-pay | Admitting: Internal Medicine

## 2013-03-05 VITALS — BP 120/82 | HR 67 | Temp 97.2°F | Resp 16 | Wt 229.0 lb

## 2013-03-05 DIAGNOSIS — H109 Unspecified conjunctivitis: Secondary | ICD-10-CM

## 2013-03-05 MED ORDER — TOBRAMYCIN-DEXAMETHASONE 0.3-0.05 % OP SUSP: 1.0000 [drp] | Freq: Four times a day (QID) | OPHTHALMIC | Status: AC

## 2013-03-05 NOTE — Progress Notes (Signed)
Subjective:    Patient ID: Erik Huffman, male    DOB: 09-25-1981, 32 y.o.   MRN: [id number]  Conjunctivitis  The current episode started more than 2 weeks ago. The onset was gradual. The problem occurs frequently. The problem has been unchanged. The problem is mild. Nothing relieves the symptoms. Nothing aggravates the symptoms. Associated symptoms include eye itching, eye discharge and eye redness. Pertinent negatives include no orthopnea, no fever, no decreased vision, no double vision, no photophobia, no abdominal pain, no constipation, no diarrhea, no nausea, no vomiting, no congestion, no ear discharge, no ear pain, no headaches, no hearing loss, no mouth sores, no rhinorrhea, no sore throat, no stridor, no swollen glands, no muscle aches, no neck pain, no cough, no URI, no wheezing, no rash and no eye pain.      Review of Systems  Constitutional: Negative.  Negative for fever.  HENT: Negative.  Negative for hearing loss, ear pain, congestion, sore throat, rhinorrhea, mouth sores, neck pain and ear discharge.   Eyes: Positive for discharge, redness and itching. Negative for double vision, photophobia, pain and visual disturbance.  Respiratory: Negative.  Negative for cough, wheezing and stridor.   Cardiovascular: Negative.  Negative for chest pain, palpitations, orthopnea and leg swelling.  Gastrointestinal: Negative.  Negative for nausea, vomiting, abdominal pain, diarrhea and constipation.  Endocrine: Negative.   Genitourinary: Negative.   Musculoskeletal: Negative.   Skin: Negative.  Negative for rash.  Allergic/Immunologic: Negative.   Neurological: Negative.  Negative for headaches.  Hematological: Negative.  Negative for adenopathy. Does not bruise/bleed easily.  Psychiatric/Behavioral: Negative.        Objective:   Physical Exam  Vitals reviewed. Constitutional: He is oriented to person, place, and time. He appears well-developed and well-nourished. No distress.  HENT:   Head: Normocephalic and atraumatic.  Mouth/Throat: Oropharynx is clear and moist. No oropharyngeal exudate.  Eyes: EOM and lids are normal. Pupils are equal, round, and reactive to light. No foreign bodies found. Right eye exhibits no chemosis, no discharge, no exudate and no hordeolum. No foreign body present in the right eye. Left eye exhibits no chemosis, no discharge, no exudate and no hordeolum. No foreign body present in the left eye. Right conjunctiva is injected. Right conjunctiva has no hemorrhage. Left conjunctiva is injected. Left conjunctiva has no hemorrhage. No scleral icterus. Right eye exhibits normal extraocular motion and no nystagmus. Left eye exhibits normal extraocular motion and no nystagmus. Right pupil is round and reactive. Left pupil is round and reactive. Pupils are equal.    Neck: Normal range of motion. Neck supple. No JVD present. No tracheal deviation present. No thyromegaly present.  Cardiovascular: Normal rate, regular rhythm, normal heart sounds and intact distal pulses.  Exam reveals no gallop and no friction rub.   No murmur heard. Pulmonary/Chest: Effort normal and breath sounds normal. No stridor. No respiratory distress. He has no wheezes. He has no rales. He exhibits no tenderness.  Abdominal: Soft. Bowel sounds are normal. He exhibits no distension and no mass. There is no tenderness. There is no rebound and no guarding.  Musculoskeletal: Normal range of motion. He exhibits no edema and no tenderness.  Lymphadenopathy:    He has no cervical adenopathy.  Neurological: He is oriented to person, place, and time.  Skin: Skin is warm and dry. No rash noted. He is not diaphoretic. No erythema. No pallor.  Psychiatric: He has a normal mood and affect. His behavior is normal. Judgment and thought content normal.  Assessment & Plan:

## 2013-03-05 NOTE — Patient Instructions (Signed)
Conjunctivitis Conjunctivitis is commonly called "pink eye." Conjunctivitis can be caused by bacterial or viral infection, allergies, or injuries. There is usually redness of the lining of the eye, itching, discomfort, and sometimes discharge. There may be deposits of matter along the eyelids. A viral infection usually causes a watery discharge, while a bacterial infection causes a yellowish, thick discharge. Pink eye is very contagious and spreads by direct contact. You may be given antibiotic eyedrops as part of your treatment. Before using your eye medicine, remove all drainage from the eye by washing gently with warm water and cotton balls. Continue to use the medication until you have awakened 2 mornings in a row without discharge from the eye. Do not rub your eye. This increases the irritation and helps spread infection. Use separate towels from other household members. Wash your hands with soap and water before and after touching your eyes. Use cold compresses to reduce pain and sunglasses to relieve irritation from light. Do not wear contact lenses or wear eye makeup until the infection is gone. SEEK MEDICAL CARE IF:   Your symptoms are not better after 3 days of treatment.  You have increased pain or trouble seeing.  The outer eyelids become very red or swollen. Document Released: 01/06/2005 Document Revised: 02/21/2012 Document Reviewed: 11/29/2005 [employer] Patient Information 2013 [employer].  

## 2013-03-05 NOTE — Assessment & Plan Note (Signed)
I think this is allergic +/- bacterial so will start tobradex oppth

## 2013-03-20 IMAGING — CT CT HEAD W/O CM
1 series · 16 of 30 positions shown, 20 images · non-contrast
Comparison: None.

CLINICAL DATA: Parasthesias

CT HEAD WITHOUT CONTRAST
TECHNIQUE: Contiguous axial images were obtained from the base of
the skull through the vertex without contrast.

[Series 2: head routine 4.8 h37s · axial · 0.47mm/px · z∈[+191,+348]mm · 16 of 36 slices shown, 20 images]
[im 2/36  brain]
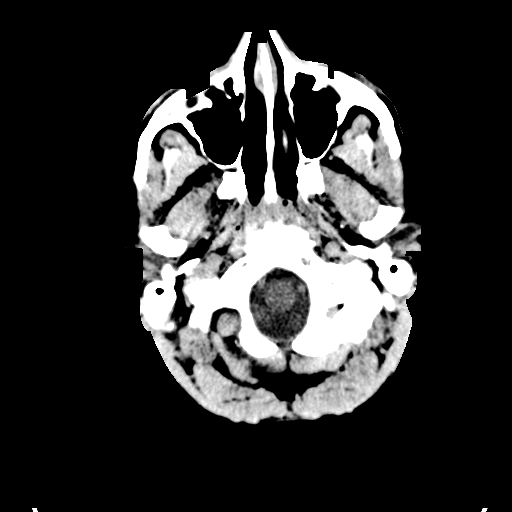
[im 2/36  bone]
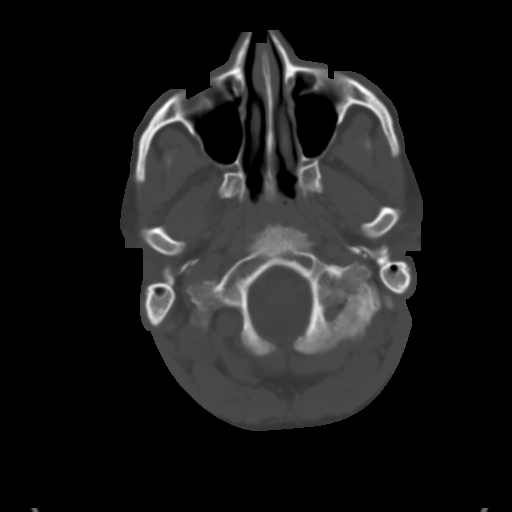
[im 4/36  brain]
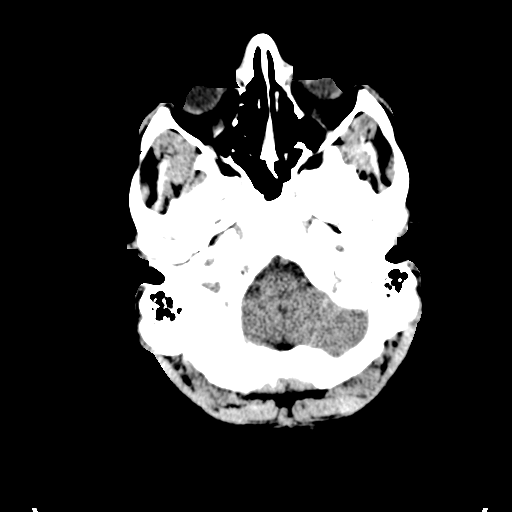
[im 7/36  brain]
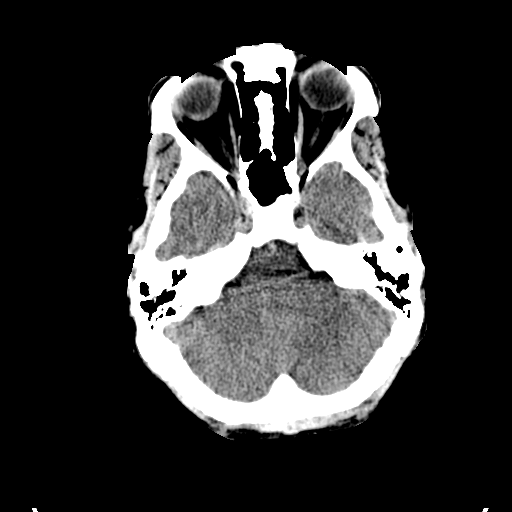
[im 9/36  brain]
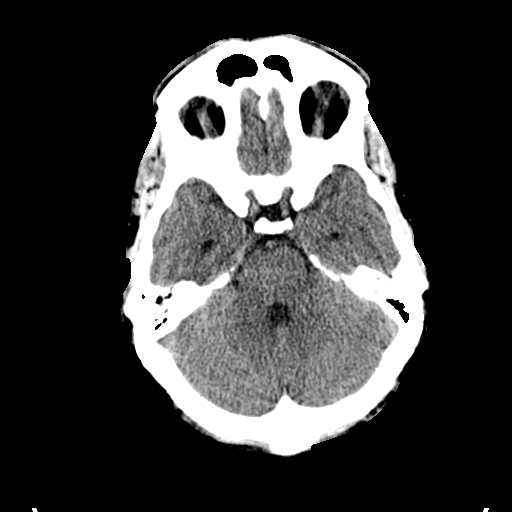
[im 10/36  brain]
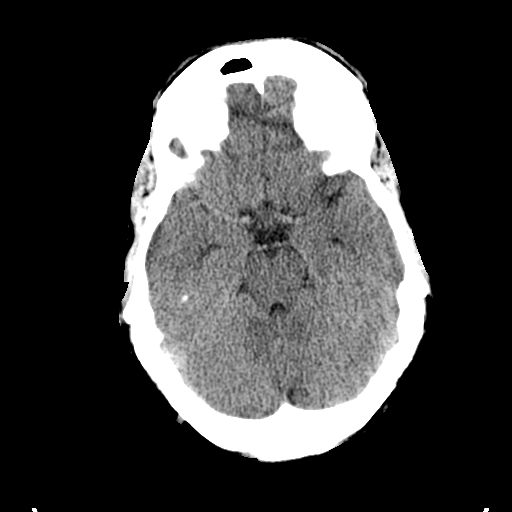
[im 10/36  bone]
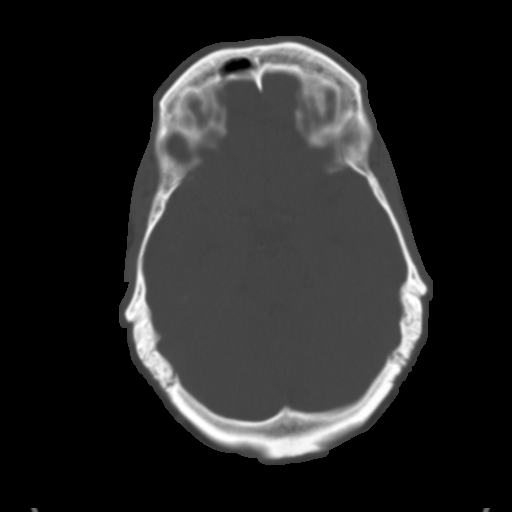
[im 13/36  brain]
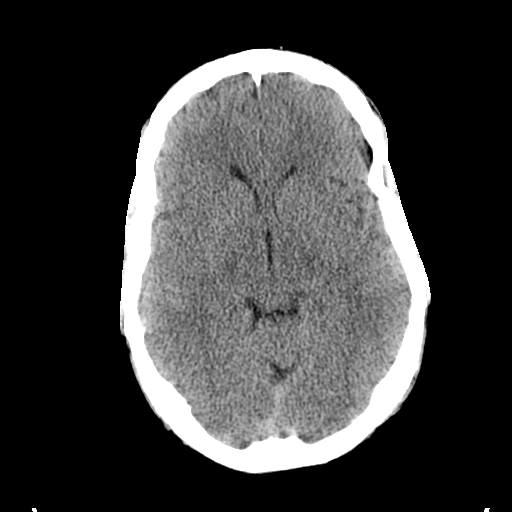
[im 15/36  brain]
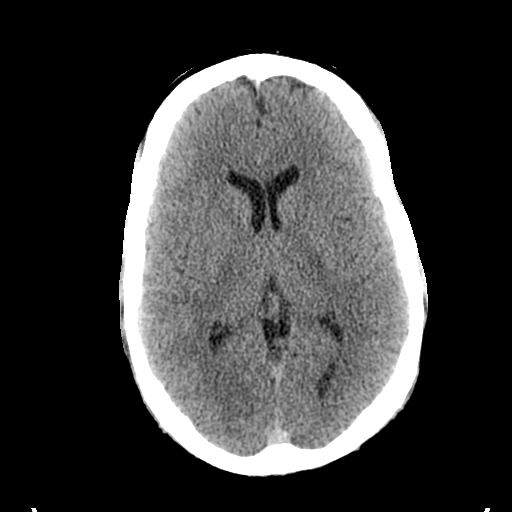
[im 17/36  brain]
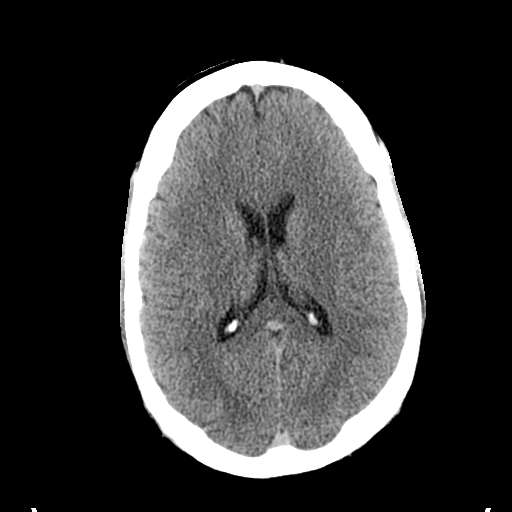
[im 19/36  brain]
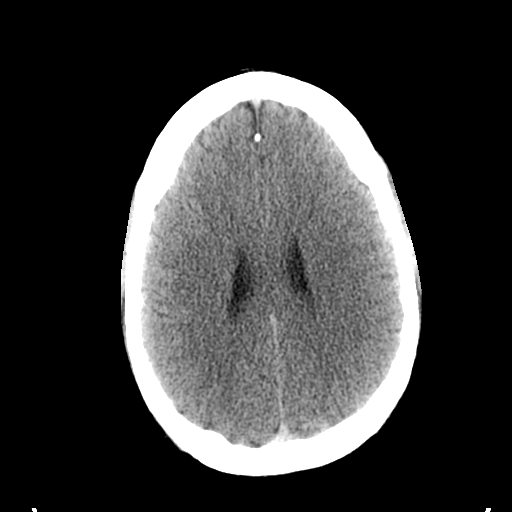
[im 19/36  bone]
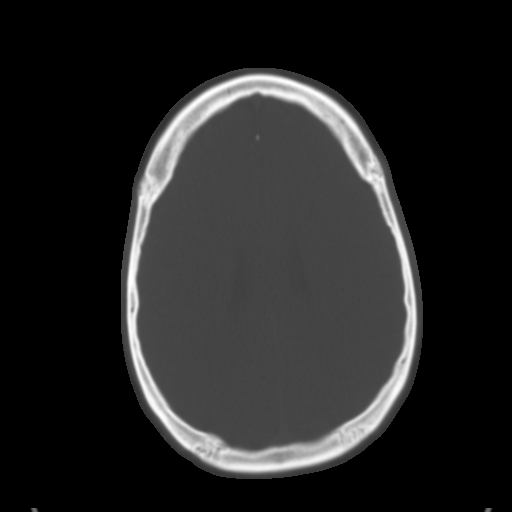
[im 21/36  brain]
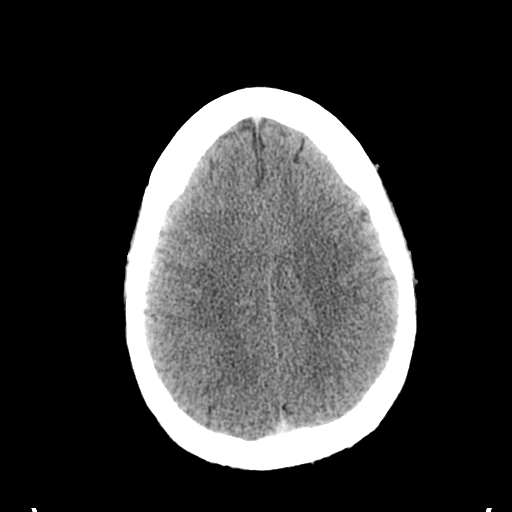
[im 23/36  brain]
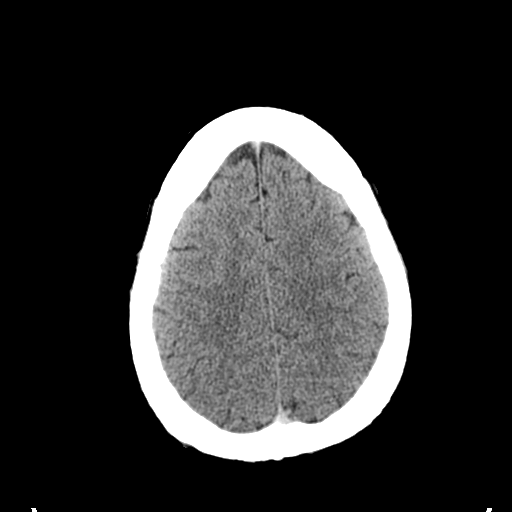
[im 26/36  brain]
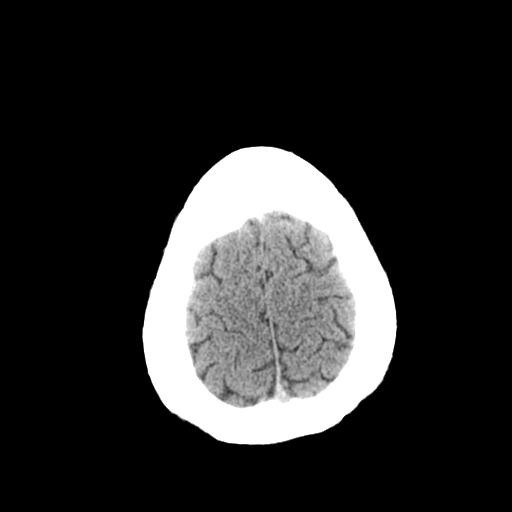
[im 27/36  brain]
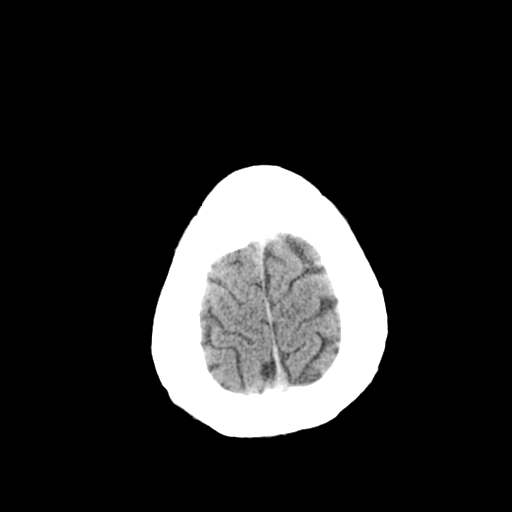
[im 27/36  bone]
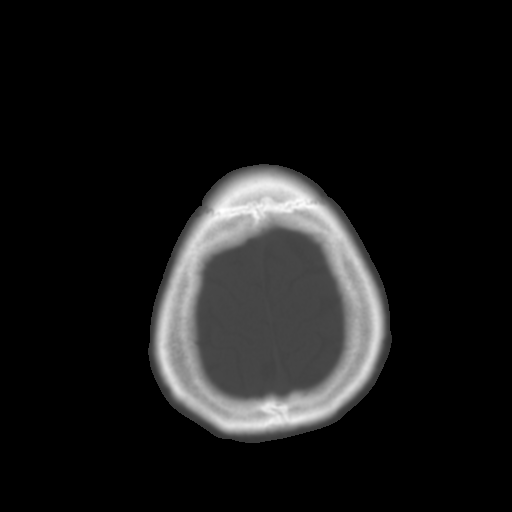
[im 29/36  brain]
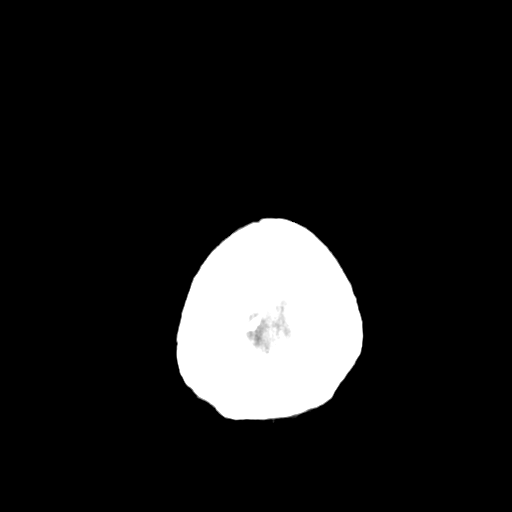
[im 32/36  brain]
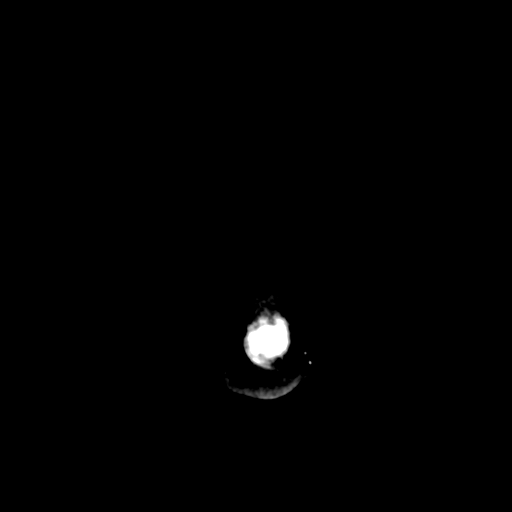
[im 34/36  brain]
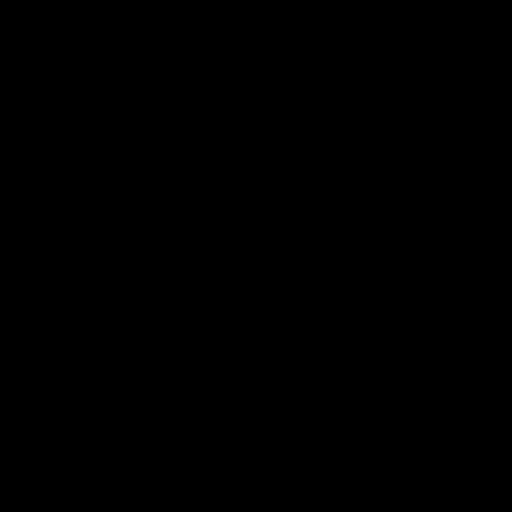

[16 of 30 positions shown; findings below may reference images not displayed]

FINDINGS: There is no evidence of acute intracranial hemorrhage,
brain edema, mass lesion, acute infarction,   mass effect, or
midline shift. Acute infarct may be inapparent on noncontrast CT.
No other intra-axial abnormalities are seen, and the ventricles and
sulci are within normal limits in size and symmetry.   No abnormal
extra-axial fluid collections or masses are identified.  No
significant calvarial abnormality.
IMPRESSION: 1. Negative for bleed or other acute intracranial process.

## 2013-05-04 ENCOUNTER — Encounter: Payer: Self-pay | Admitting: Internal Medicine

## 2013-05-04 ENCOUNTER — Ambulatory Visit (INDEPENDENT_AMBULATORY_CARE_PROVIDER_SITE_OTHER): Payer: BC Managed Care – PPO | Admitting: Internal Medicine

## 2013-05-04 ENCOUNTER — Other Ambulatory Visit (INDEPENDENT_AMBULATORY_CARE_PROVIDER_SITE_OTHER): Payer: BC Managed Care – PPO

## 2013-05-04 VITALS — BP 126/84 | HR 78 | Temp 98.2°F | Resp 16 | Ht 71.0 in | Wt 230.0 lb

## 2013-05-04 DIAGNOSIS — E785 Hyperlipidemia, unspecified: Secondary | ICD-10-CM

## 2013-05-04 DIAGNOSIS — I10 Essential (primary) hypertension: Secondary | ICD-10-CM

## 2013-05-04 DIAGNOSIS — E781 Pure hyperglyceridemia: Secondary | ICD-10-CM | POA: Insufficient documentation

## 2013-05-04 DIAGNOSIS — R400 Somnolence: Secondary | ICD-10-CM | POA: Insufficient documentation

## 2013-05-04 DIAGNOSIS — G4733 Obstructive sleep apnea (adult) (pediatric): Secondary | ICD-10-CM

## 2013-05-04 DIAGNOSIS — R404 Transient alteration of awareness: Secondary | ICD-10-CM

## 2013-05-04 LAB — COMPREHENSIVE METABOLIC PANEL
AST: 33 U/L (ref 0–37)
BUN: 15 mg/dL (ref 6–23)
CO2: 27 mEq/L (ref 19–32)
Calcium: 9.9 mg/dL (ref 8.4–10.5)
Chloride: 104 mEq/L (ref 96–112)
Creatinine, Ser: 1 mg/dL (ref 0.4–1.5)
GFR: 89.94 mL/min (ref >60.00)

## 2013-05-04 LAB — LDL CHOLESTEROL, DIRECT: Direct LDL: 51.8 mg/dL

## 2013-05-04 LAB — TSH: TSH: 1.26 u[IU]/mL (ref 0.35–5.50)

## 2013-05-04 LAB — LIPID PANEL: Triglycerides: 313 mg/dL — ABNORMAL HIGH (ref 0.0–149.0)

## 2013-05-04 MED ORDER — MODAFINIL 100 MG PO TABS: 100.0000 mg | ORAL_TABLET | Freq: Every day | ORAL | Status: DC

## 2013-05-04 MED ORDER — OLMESARTAN MEDOXOMIL 40 MG PO TABS: 40.0000 mg | ORAL_TABLET | Freq: Every day | ORAL | Status: DC

## 2013-05-04 NOTE — Assessment & Plan Note (Signed)
FLP today 

## 2013-05-04 NOTE — Assessment & Plan Note (Signed)
I have asked him to see sleep med for a CPAP eval/titration

## 2013-05-04 NOTE — Assessment & Plan Note (Signed)
FLP today He will try to improve his lifestyle modifications

## 2013-05-04 NOTE — Patient Instructions (Signed)

## 2013-05-04 NOTE — Assessment & Plan Note (Signed)
His BP is well controlled Will continue Benicar Today I will check his lytes and renal function

## 2013-05-04 NOTE — Progress Notes (Signed)
  Subjective:    Patient ID: Erik Huffman, male    DOB: 1981/05/22, 32 y.o.   MRN: [id number]  Hypertension This is a chronic problem. The current episode started more than 1 year ago. The problem has been gradually improving since onset. The problem is controlled. Associated symptoms include malaise/fatigue. Pertinent negatives include no anxiety, blurred vision, chest pain, headaches, neck pain, orthopnea, palpitations, peripheral edema, PND, shortness of breath or sweats. Past treatments include angiotensin blockers. The current treatment provides significant improvement. Compliance problems include exercise and diet.  Identifiable causes of hypertension include sleep apnea.      Review of Systems  Constitutional: Positive for malaise/fatigue and fatigue (severe daytime fatigue, he falls asleep very easily). Negative for fever, chills, diaphoresis, activity change, appetite change and unexpected weight change.  HENT: Negative.  Negative for neck pain.   Eyes: Negative.  Negative for blurred vision.  Respiratory: Positive for apnea. Negative for cough, choking, chest tightness, shortness of breath, wheezing and stridor.   Cardiovascular: Negative.  Negative for chest pain, palpitations, orthopnea, leg swelling and PND.  Gastrointestinal: Negative.  Negative for nausea, vomiting, diarrhea, constipation, abdominal distention and anal bleeding.  Endocrine: Negative.   Genitourinary: Negative.   Musculoskeletal: Negative.   Skin: Negative.   Allergic/Immunologic: Negative.   Neurological: Negative.  Negative for dizziness and headaches.  Hematological: Negative.  Negative for adenopathy. Does not bruise/bleed easily.  Psychiatric/Behavioral: Negative.        Objective:   Physical Exam  Vitals reviewed. Constitutional: He is oriented to person, place, and time. He appears well-developed and well-nourished. No distress.  HENT:  Head: Normocephalic and atraumatic.  Mouth/Throat: No  oropharyngeal exudate.  Eyes: Conjunctivae are normal. Right eye exhibits no discharge. Left eye exhibits no discharge. No scleral icterus.  Neck: Normal range of motion. Neck supple. No JVD present. No tracheal deviation present. No thyromegaly present.  Cardiovascular: Normal rate, regular rhythm, normal heart sounds and intact distal pulses.  Exam reveals no gallop and no friction rub.   No murmur heard. Pulmonary/Chest: Effort normal and breath sounds normal. No stridor. No respiratory distress. He has no wheezes. He has no rales. He exhibits no tenderness.  Abdominal: Soft. Bowel sounds are normal. He exhibits no distension and no mass. There is no tenderness. There is no rebound and no guarding.  Musculoskeletal: Normal range of motion. He exhibits no edema and no tenderness.  Lymphadenopathy:    He has no cervical adenopathy.  Neurological: He is oriented to person, place, and time.  Skin: Skin is warm and dry. No rash noted. He is not diaphoretic. No erythema. No pallor.  Psychiatric: He has a normal mood and affect. His behavior is normal. Judgment and thought content normal.      Lab Results  Component Value Date   WBC 8.5 02/18/2012   HGB 15.3 02/18/2012   HCT 44.0 02/18/2012   PLT 290.0 02/18/2012   GLUCOSE 60* 02/18/2012   CHOL 146 02/18/2012   TRIG 315.0* 02/18/2012   HDL 39.70 02/18/2012   LDLDIRECT 46.5 02/18/2012   ALT 45 02/18/2012   AST 26 02/18/2012   NA 139 02/18/2012   K 4.1 02/18/2012   CL 102 02/18/2012   CREATININE 1.0 02/18/2012   BUN 18 02/18/2012   CO2 27 02/18/2012   TSH 1.98 02/18/2012   HGBA1C 5.0 02/18/2012      Assessment & Plan:

## 2013-05-04 NOTE — Assessment & Plan Note (Signed)
Will try provigil I have asked him to f/up with sleep med as well

## 2013-05-10 ENCOUNTER — Encounter: Payer: Self-pay | Admitting: Pulmonary Disease

## 2013-05-10 ENCOUNTER — Ambulatory Visit: Payer: BC Managed Care – PPO | Admitting: Pulmonary Disease

## 2013-05-10 ENCOUNTER — Ambulatory Visit (INDEPENDENT_AMBULATORY_CARE_PROVIDER_SITE_OTHER): Payer: BC Managed Care – PPO | Admitting: Pulmonary Disease

## 2013-05-10 VITALS — BP 128/78 | HR 73 | Temp 98.7°F | Ht 71.0 in | Wt 234.4 lb

## 2013-05-10 DIAGNOSIS — G4733 Obstructive sleep apnea (adult) (pediatric): Secondary | ICD-10-CM

## 2013-05-10 NOTE — Assessment & Plan Note (Signed)
The patient has been trying to wear CPAP as much is possible, but unfortunately he is sleeping only 2-3 hours a day during the afternoon hours.  I have told him it does not matter how well we treat his sleep apnea if he does not get enough sleep.  I have reminded him of his moral responsibility to not drive if he is sleepy.  We will also need to optimize his pressure, and we'll do this on the automatic setting.

## 2013-05-10 NOTE — Patient Instructions (Addendum)
Will set your machine on the auto setting to optimize your pressure.  Will let you know once I get the download.  You can decide to stay on the auto setting if this is more comfortable for you.  Work on [employer] loss You must get more sleep.  Do not drive if you are sleepy.  If doing well, followup with me in 6mos .

## 2013-05-10 NOTE — Progress Notes (Signed)
  Subjective:    Patient ID: Erik Huffman, male    DOB: 05/12/1981, 32 y.o.   MRN: [id number]  HPI The patient comes in today for followup of his obstructive sleep apnea.  He was started on CPAP at the last visit, but did not followup in order to make adjustments to his pressure.  I have explained that we are not completely treating his sleep apnea at this time.  The patient continues to work third shift, runs errands during the day, and cares for his kids in the afternoon.  He is only getting about 2-3 hours of sleep a day, and tries to wear the CPAP during this time.  He is having no issues with the mask fit or pressure, and thinks the machine does help when he wears it.   Review of Systems  Constitutional: Negative for fever and unexpected weight change.  HENT: Negative for ear pain, nosebleeds, congestion, sore throat, rhinorrhea, sneezing, trouble swallowing, dental problem, postnasal drip and sinus pressure.   Eyes: Negative for redness and itching.  Respiratory: Negative for cough, chest tightness, shortness of breath and wheezing.   Cardiovascular: Negative for palpitations and leg swelling.  Gastrointestinal: Negative for nausea and vomiting.  Genitourinary: Negative for dysuria.  Musculoskeletal: Negative for joint swelling.  Skin: Negative for rash.  Neurological: Positive for headaches.  Hematological: Does not bruise/bleed easily.  Psychiatric/Behavioral: Negative for dysphoric mood. The patient is not nervous/anxious.        Objective:   Physical Exam Overweight male in no acute distress Nose without purulence or discharge noted No skin breakdown or pressure necrosis from the CPAP mask Neck without lymphadenopathy or thyromegaly Lower extremities without edema, Appears very sleepy, moves all 4 extremities.       Assessment & Plan:

## 2013-06-24 ENCOUNTER — Other Ambulatory Visit: Payer: Self-pay | Admitting: Pulmonary Disease

## 2013-06-24 DIAGNOSIS — G4733 Obstructive sleep apnea (adult) (pediatric): Secondary | ICD-10-CM

## 2013-11-03 ENCOUNTER — Other Ambulatory Visit: Payer: Self-pay | Admitting: Internal Medicine

## 2013-11-12 ENCOUNTER — Ambulatory Visit: Payer: BC Managed Care – PPO | Admitting: Pulmonary Disease

## 2013-11-13 ENCOUNTER — Ambulatory Visit (INDEPENDENT_AMBULATORY_CARE_PROVIDER_SITE_OTHER): Payer: BC Managed Care – PPO | Admitting: Family Medicine

## 2013-11-13 VITALS — BP 128/80 | HR 68 | Temp 98.4°F | Resp 16 | Ht 70.5 in | Wt 235.0 lb

## 2013-11-13 DIAGNOSIS — R5381 Other malaise: Secondary | ICD-10-CM

## 2013-11-13 DIAGNOSIS — R5383 Other fatigue: Secondary | ICD-10-CM

## 2013-11-13 NOTE — Progress Notes (Signed)
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  He has sleep apnea, works third shift, and married with two kids.  Last time he slept was yesterday.  Supposed to be in class now.  Exhausted.  Objective:  Appears tired, but has good eye contact and seems reasonable.  Just needs note for school and work.

## 2013-11-13 NOTE — Patient Instructions (Signed)

## 2013-11-16 ENCOUNTER — Other Ambulatory Visit: Payer: Self-pay | Admitting: Internal Medicine

## 2013-11-19 ENCOUNTER — Other Ambulatory Visit: Payer: Self-pay | Admitting: Internal Medicine

## 2013-12-10 ENCOUNTER — Ambulatory Visit: Payer: BC Managed Care – PPO | Admitting: Physician Assistant

## 2013-12-10 VITALS — BP 122/85 | HR 104 | Temp 102.5°F | Resp 18 | Wt 233.0 lb

## 2013-12-10 DIAGNOSIS — J111 Influenza due to unidentified influenza virus with other respiratory manifestations: Secondary | ICD-10-CM

## 2013-12-10 DIAGNOSIS — R509 Fever, unspecified: Secondary | ICD-10-CM

## 2013-12-10 DIAGNOSIS — J029 Acute pharyngitis, unspecified: Secondary | ICD-10-CM

## 2013-12-10 LAB — POCT CBC
HCT, POC: 39.2 % — AB (ref 43.5–53.7)
Lymph, poc: 1 (ref 0.6–3.4)
MCH, POC: 29 pg (ref 27–31.2)
MCHC: 31.6 g/dL — AB (ref 31.8–35.4)
MCV: 91.8 fL (ref 80–97)
POC LYMPH PERCENT: 20.2 %L (ref 10–50)
RDW, POC: 13.2 %
WBC: 5 10*3/uL (ref 4.6–10.2)

## 2013-12-10 MED ORDER — HYDROCODONE-HOMATROPINE 5-1.5 MG/5ML PO SYRP: ORAL_SOLUTION | ORAL | Status: DC

## 2013-12-10 MED ORDER — DOXYCYCLINE HYCLATE 100 MG PO CAPS: 100.0000 mg | ORAL_CAPSULE | Freq: Two times a day (BID) | ORAL | Status: DC

## 2013-12-10 NOTE — Progress Notes (Signed)
Subjective:    Patient ID: Erik Huffman, male    DOB: 07-29-1981, 32 y.o.   MRN: [id number]  HPI 32 year old male presents with a 6-7 day history of sore throat, fever, chills, myalgias, and a 1 day history of sub mandibular lymphadenopathy after taking an amoxicillin. T max 102.5. Mild non productive cough. No SOB or wheezing. Sinus headache. No congestion or rhinorrhea. He did vomit one time after a bad coughing episode where he became dizzy. Otherwise no nausea, vomiting, or diarrhea. His wife has been sick with similar symptoms. He has been trying Theraflu and Advil without much success. No rashes. No cat bites, or scratches. No cats live in the house.   Past Medical History  Diagnosis Date  . Hypertension    No Known Allergies  History   Social History  . Marital Status: Married    Spouse Name: Zylen Wenig     Number of Children: Y  . Years of Education: N/A   Occupational History  . SUPERVISOR/ chemical operator [employer]   Social History Main Topics  . Smoking status: Never Smoker   . Smokeless tobacco: Never Used  . Alcohol Use: No  . Drug Use: No  . Sexual Activity: Yes   Other Topics Concern  . Not on file   Social History Narrative  . No narrative on file   Family History  Problem Relation Age of Onset  . Hypertension Mother   . Heart disease Father   . Cancer Neg Hx   . Stroke Neg Hx   . COPD Maternal Grandmother   . Heart disease Maternal Grandfather     Review of Systems  Constitutional: Positive for fever, chills, appetite change and fatigue.  HENT: Positive for sinus pressure and sore throat. Negative for congestion, ear discharge, ear pain, hearing loss, postnasal drip and rhinorrhea.   Respiratory: Positive for cough. Negative for shortness of breath and wheezing.   Gastrointestinal: Positive for vomiting. Negative for nausea and diarrhea.  Neurological: Positive for headaches.       Objective:   Physical Exam  Physical  Exam: Blood pressure 122/85, pulse 104, temperature 102.5 F (39.2 C), temperature source Oral, resp. rate 18, weight 233 lb (105.688 kg), SpO2 98.00%., Body mass index is 32.95 kg/(m^2). General: Well developed, well nourished, in no acute distress. Head: Normocephalic, atraumatic, eyes without discharge, sclera non-icteric, nares are congested. Bilateral auditory canals clear, TM's are without perforation, pearly grey with reflective cone of light bilaterally. No sinus TTP. Oral cavity moist, dentition normal. Posterior pharynx with post nasal drip and mild erythema. No peritonsillar abscess or tonsillar exudate. Uvula midline.  Neck: Supple. No thyromegaly. Full ROM. Lymph nodes: less than 2 cm sub mandibular and less than 2 cm left AC. Lungs: Clear to auscultation bilaterally without wheezes, rales, or rhonchi. Breathing is unlabored.  Heart: RRR with S1 S2. No murmurs, rubs, or gallops appreciated. Msk:  Strength and tone normal for age. Extremities: No clubbing or cyanosis. No edema. Neuro: Alert and oriented X 3. Moves all extremities spontaneously. CNII-XII grossly in tact. Psych:  Responds to questions appropriately with a normal affect.   Labs: Results for orders placed in visit on 12/10/13  POCT CBC      Result Value Range   WBC 5.0  4.6 - 10.2 K/uL   Lymph, poc 1.0  0.6 - 3.4   POC LYMPH PERCENT 20.2  10 - 50 %L   MID (cbc) 0.4  0 -  0.9   POC MID % 8.9  0 - 12 %M   POC Granulocyte 3.5  2 - 6.9   Granulocyte percent 70.9  37 - 80 %G   RBC 4.27 (*) 4.69 - 6.13 M/uL   Hemoglobin 12.4 (*) 14.1 - 18.1 g/dL   HCT, POC 16.1 (*) 09.6 - 53.7 %   MCV 91.8  80 - 97 fL   MCH, POC 29.0  27 - 31.2 pg   MCHC 31.6 (*) 31.8 - 35.4 g/dL   RDW, POC 04.5     Platelet Count, POC 180  142 - 424 K/uL   MPV 8.2  0 - 99.8 fL  POCT INFLUENZA A/B      Result Value Range   Influenza A, POC Negative     Influenza B, POC Negative    POCT RAPID STREP A (OFFICE)      Result Value Range   Rapid  Strep A Screen Negative  Negative   Throat culture pending     Assessment & Plan:  32 year old male with influenza-like illness, lymphadenopathy, pharyngitis, and cough. -Doxycycline 100 mg 1 po bid #20 no RF -Hycodan #4oz 1 tsp po q 4-6 hours prn cough no RF SED -Call if still with fever  -Out of work through 12/11/13 -No cats in the house, no cat bites or scratches  -RTC precautions  Eula Listen, PA-C [facility] [address] [place], [place] [address] [phone] 12/10/2013 8:32 PM

## 2013-12-13 LAB — CULTURE, GROUP A STREP: Organism ID, Bacteria: NORMAL

## 2013-12-20 ENCOUNTER — Ambulatory Visit (INDEPENDENT_AMBULATORY_CARE_PROVIDER_SITE_OTHER): Payer: BC Managed Care – PPO | Admitting: Pulmonary Disease

## 2013-12-20 ENCOUNTER — Encounter: Payer: Self-pay | Admitting: Pulmonary Disease

## 2013-12-20 ENCOUNTER — Encounter (INDEPENDENT_AMBULATORY_CARE_PROVIDER_SITE_OTHER): Payer: Self-pay

## 2013-12-20 VITALS — BP 148/92 | HR 88 | Temp 98.1°F | Ht 71.0 in | Wt 237.0 lb

## 2013-12-20 DIAGNOSIS — G4733 Obstructive sleep apnea (adult) (pediatric): Secondary | ICD-10-CM

## 2013-12-20 NOTE — Patient Instructions (Signed)
Continue with cpap, and let us know if you continue to have issues with mask leaking. Work on weight loss No driving if sleepy followup with me in one year if doing ok.

## 2013-12-20 NOTE — Progress Notes (Signed)
   Subjective:    Patient ID: Erik Huffman, male    DOB: 1980-12-26, 33 y.o.   MRN: [id number]  HPI Patient comes in today for followup of his known obstructive sleep apnea. He is wearing CPAP compliantly at optimal pressure, but is continuing to have some mild leaking at times from his mask. He is still working third shift, and has childcare duties during the day, and therefore is only getting about 4 hours of sleep the day. He feels that he is doing better with the CPAP device.    Review of Systems  Constitutional: Negative for fever and unexpected weight change.  HENT: Negative for congestion, dental problem, ear pain, nosebleeds, postnasal drip, rhinorrhea, sinus pressure, sneezing, sore throat and trouble swallowing.   Eyes: Negative for redness and itching.  Respiratory: Negative for cough, chest tightness, shortness of breath and wheezing.   Cardiovascular: Negative for palpitations and leg swelling.  Gastrointestinal: Negative for nausea and vomiting.  Genitourinary: Negative for dysuria.  Musculoskeletal: Negative for joint swelling.  Skin: Negative for rash.  Neurological: Negative for headaches.  Hematological: Does not bruise/bleed easily.  Psychiatric/Behavioral: Negative for dysphoric mood. The patient is not nervous/anxious.        Objective:   Physical Exam Overweight male in no acute distress Nose without purulence or discharge noted Neck without lymphadenopathy or thyromegaly No skin breakdown or pressure necrosis from the CPAP mask Patient appears sleepy, but answers questions appropriately. Moves all 4 extremities.       Assessment & Plan:

## 2013-12-20 NOTE — Assessment & Plan Note (Signed)
The patient is wearing CPAP compliantly, but is still having issues with mask leaking at times. He thinks this can be improved with adjustments. I've asked him to call me if he continues to have issues, and we can arrange for a fitting at the sleep Center. He feels that his pressure is much better at a higher level, and I've encouraged him to work aggressively on weight loss.

## 2014-02-18 ENCOUNTER — Ambulatory Visit (INDEPENDENT_AMBULATORY_CARE_PROVIDER_SITE_OTHER): Payer: BC Managed Care – PPO | Admitting: Internal Medicine

## 2014-02-18 ENCOUNTER — Encounter: Payer: Self-pay | Admitting: Internal Medicine

## 2014-02-18 VITALS — BP 126/80 | HR 80 | Temp 98.2°F | Resp 16 | Ht 71.0 in | Wt 235.0 lb

## 2014-02-18 DIAGNOSIS — G4733 Obstructive sleep apnea (adult) (pediatric): Secondary | ICD-10-CM

## 2014-02-18 DIAGNOSIS — R404 Transient alteration of awareness: Secondary | ICD-10-CM

## 2014-02-18 DIAGNOSIS — R400 Somnolence: Secondary | ICD-10-CM

## 2014-02-18 DIAGNOSIS — I10 Essential (primary) hypertension: Secondary | ICD-10-CM

## 2014-02-18 MED ORDER — MODAFINIL 200 MG PO TABS: 200.0000 mg | ORAL_TABLET | Freq: Every day | ORAL | Status: AC

## 2014-02-18 NOTE — Patient Instructions (Signed)

## 2014-02-18 NOTE — Progress Notes (Signed)
   Subjective:    Patient ID: Erik Huffman, male    DOB: 09-19-1981, 33 y.o.   MRN: [id number]  HPI Comments: He returns for a f/up on sleep apnea, excessive daytime somnolence, and shift work sleep disorder. He wants to try a higher dose of the provigil, otherwise he feels well.     Review of Systems  Constitutional: Positive for fatigue. Negative for fever, chills, diaphoresis and appetite change.  HENT: Negative.   Eyes: Negative.   Respiratory: Negative.  Negative for cough, choking, chest tightness, shortness of breath and stridor.   Cardiovascular: Negative.  Negative for chest pain, palpitations and leg swelling.  Gastrointestinal: Negative.  Negative for nausea, abdominal pain, diarrhea, constipation and blood in stool.  Endocrine: Negative.   Genitourinary: Negative.   Musculoskeletal: Negative.   Skin: Negative.   Allergic/Immunologic: Negative.   Neurological: Negative.  Negative for dizziness, tremors, weakness, light-headedness, numbness and headaches.  Hematological: Negative.  Negative for adenopathy. Does not bruise/bleed easily.  Psychiatric/Behavioral: Negative.  Negative for hallucinations, sleep disturbance, dysphoric mood and decreased concentration. The patient is not nervous/anxious.        Objective:   Physical Exam  Vitals reviewed. Constitutional: He is oriented to person, place, and time. He appears well-developed and well-nourished. No distress.  HENT:  Head: Normocephalic and atraumatic.  Mouth/Throat: Oropharynx is clear and moist. No oropharyngeal exudate.  Eyes: Conjunctivae are normal. Right eye exhibits no discharge. Left eye exhibits no discharge. No scleral icterus.  Neck: Normal range of motion. Neck supple. No JVD present. No tracheal deviation present. No thyromegaly present.  Cardiovascular: Normal rate, regular rhythm, normal heart sounds and intact distal pulses.  Exam reveals no gallop and no friction rub.   No murmur  heard. Pulmonary/Chest: Effort normal and breath sounds normal. No stridor. No respiratory distress. He has no wheezes. He has no rales. He exhibits no tenderness.  Abdominal: Soft. Bowel sounds are normal. He exhibits no distension and no mass. There is no tenderness. There is no rebound and no guarding.  Musculoskeletal: Normal range of motion. He exhibits no edema and no tenderness.  Lymphadenopathy:    He has no cervical adenopathy.  Neurological: He is oriented to person, place, and time.  Skin: Skin is warm and dry. No rash noted. He is not diaphoretic. No erythema. No pallor.  Psychiatric: He has a normal mood and affect. His behavior is normal. Judgment and thought content normal.     Lab Results  Component Value Date   WBC 5.0 12/10/2013   HGB 12.4* 12/10/2013   HCT 39.2* 12/10/2013   PLT 290.0 02/18/2012   GLUCOSE 96 05/04/2013   CHOL 147 05/04/2013   TRIG 313.0* 05/04/2013   HDL 39.10 05/04/2013   LDLDIRECT 51.8 05/04/2013   ALT 47 05/04/2013   AST 33 05/04/2013   NA 141 05/04/2013   K 4.0 05/04/2013   CL 104 05/04/2013   CREATININE 1.0 05/04/2013   BUN 15 05/04/2013   CO2 27 05/04/2013   TSH 1.26 05/04/2013   HGBA1C 5.0 02/18/2012       Assessment & Plan:

## 2014-02-18 NOTE — Progress Notes (Signed)
Pre visit review using our clinic review tool, if applicable. No additional management support is needed unless otherwise documented below in the visit note. 

## 2014-02-19 ENCOUNTER — Encounter: Payer: Self-pay | Admitting: Internal Medicine

## 2014-02-19 NOTE — Assessment & Plan Note (Signed)
His BP is well controlled 

## 2014-02-19 NOTE — Assessment & Plan Note (Signed)
He will try the higher dose of provigil

## 2014-02-19 NOTE — Assessment & Plan Note (Signed)
Will try the higher dose of provigil

## 2014-06-27 ENCOUNTER — Other Ambulatory Visit: Payer: Self-pay | Admitting: Physician Assistant

## 2014-11-25 ENCOUNTER — Ambulatory Visit: Payer: BC Managed Care – PPO | Admitting: Pulmonary Disease
